# Patient Record
Sex: Female | Born: 1995 | Race: White | Hispanic: No | Marital: Married | Smoking: Former smoker
Health system: Southern US, Community
[De-identification: ages and names within clinical notes are randomized; demographics above are authoritative.]

## PROBLEM LIST (undated history)

## (undated) ENCOUNTER — Inpatient Hospital Stay (HOSPITAL_COMMUNITY): Payer: Self-pay

## (undated) DIAGNOSIS — F32A Depression, unspecified: Secondary | ICD-10-CM

## (undated) DIAGNOSIS — G43909 Migraine, unspecified, not intractable, without status migrainosus: Secondary | ICD-10-CM

## (undated) DIAGNOSIS — Z8709 Personal history of other diseases of the respiratory system: Secondary | ICD-10-CM

## (undated) DIAGNOSIS — I471 Supraventricular tachycardia, unspecified: Secondary | ICD-10-CM

## (undated) DIAGNOSIS — Z9889 Other specified postprocedural states: Secondary | ICD-10-CM

## (undated) DIAGNOSIS — Z8489 Family history of other specified conditions: Secondary | ICD-10-CM

## (undated) DIAGNOSIS — G932 Benign intracranial hypertension: Secondary | ICD-10-CM

## (undated) DIAGNOSIS — Z87442 Personal history of urinary calculi: Secondary | ICD-10-CM

## (undated) DIAGNOSIS — I10 Essential (primary) hypertension: Secondary | ICD-10-CM

## (undated) DIAGNOSIS — R112 Nausea with vomiting, unspecified: Secondary | ICD-10-CM

## (undated) HISTORY — PX: GASTRIC BYPASS: SHX52

## (undated) HISTORY — PX: TONSILLECTOMY AND ADENOIDECTOMY: SUR1326

---
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
## 2017-01-17 ENCOUNTER — Inpatient Hospital Stay (HOSPITAL_COMMUNITY)
Admission: AD | Admit: 2017-01-17 | Payer: Managed Care, Other (non HMO) | Source: Ambulatory Visit | Admitting: Obstetrics & Gynecology

## 2017-03-25 ENCOUNTER — Encounter (HOSPITAL_COMMUNITY): Payer: Self-pay | Admitting: *Deleted

## 2017-03-25 ENCOUNTER — Observation Stay (HOSPITAL_COMMUNITY)
Admission: AD | Admit: 2017-03-25 | Discharge: 2017-03-26 | Disposition: A | Discharge status: Home / Self Care | Payer: Managed Care, Other (non HMO) | Source: Ambulatory Visit | Attending: Obstetrics and Gynecology | Admitting: Obstetrics and Gynecology

## 2017-03-25 DIAGNOSIS — G43009 Migraine without aura, not intractable, without status migrainosus: Secondary | ICD-10-CM

## 2017-03-25 DIAGNOSIS — Z87728 Personal history of other specified (corrected) congenital malformations of nervous system and sense organs: Secondary | ICD-10-CM | POA: Diagnosis not present

## 2017-03-25 DIAGNOSIS — R51 Headache: Secondary | ICD-10-CM | POA: Diagnosis present

## 2017-03-25 DIAGNOSIS — O9989 Other specified diseases and conditions complicating pregnancy, childbirth and the puerperium: Principal | ICD-10-CM | POA: Insufficient documentation

## 2017-03-25 DIAGNOSIS — Z3A26 26 weeks gestation of pregnancy: Secondary | ICD-10-CM | POA: Diagnosis not present

## 2017-03-25 DIAGNOSIS — O219 Vomiting of pregnancy, unspecified: Secondary | ICD-10-CM | POA: Diagnosis not present

## 2017-03-25 DIAGNOSIS — G43909 Migraine, unspecified, not intractable, without status migrainosus: Secondary | ICD-10-CM | POA: Insufficient documentation

## 2017-03-25 DIAGNOSIS — O169 Unspecified maternal hypertension, unspecified trimester: Secondary | ICD-10-CM | POA: Diagnosis present

## 2017-03-25 DIAGNOSIS — H53149 Visual discomfort, unspecified: Secondary | ICD-10-CM | POA: Insufficient documentation

## 2017-03-25 DIAGNOSIS — O162 Unspecified maternal hypertension, second trimester: Secondary | ICD-10-CM

## 2017-03-25 DIAGNOSIS — Z8669 Personal history of other diseases of the nervous system and sense organs: Secondary | ICD-10-CM | POA: Diagnosis not present

## 2017-03-25 DIAGNOSIS — Z79899 Other long term (current) drug therapy: Secondary | ICD-10-CM | POA: Diagnosis not present

## 2017-03-25 HISTORY — DX: Nausea with vomiting, unspecified: R11.2

## 2017-03-25 HISTORY — DX: Nausea with vomiting, unspecified: Z98.890

## 2017-03-25 LAB — URINALYSIS, ROUTINE W REFLEX MICROSCOPIC
Bilirubin Urine: NEGATIVE
Glucose, UA: NEGATIVE mg/dL
Ketones, ur: 20 mg/dL — AB
Nitrite: NEGATIVE
Protein, ur: 30 mg/dL — AB
Specific Gravity, Urine: 1.026 (ref 1.005–1.030)
pH: 6 (ref 5.0–8.0)

## 2017-03-25 LAB — CBC
HCT: 37 % (ref 36.0–46.0)
Hemoglobin: 12 g/dL (ref 12.0–15.0)
MCH: 25.8 pg — ABNORMAL LOW (ref 26.0–34.0)
MCHC: 32.4 g/dL (ref 30.0–36.0)
MCV: 79.6 fL (ref 78.0–100.0)
Platelets: 276 10*3/uL (ref 150–400)
RBC: 4.65 MIL/uL (ref 3.87–5.11)
RDW: 14.6 % (ref 11.5–15.5)
WBC: 10.2 10*3/uL (ref 4.0–10.5)

## 2017-03-25 LAB — COMPREHENSIVE METABOLIC PANEL
ALT: 29 U/L (ref 14–54)
AST: 19 U/L (ref 15–41)
Albumin: 3.1 g/dL — ABNORMAL LOW (ref 3.5–5.0)
Alkaline Phosphatase: 82 U/L (ref 38–126)
Anion gap: 10 (ref 5–15)
BUN: 7 mg/dL (ref 6–20)
CO2: 21 mmol/L — ABNORMAL LOW (ref 22–32)
Calcium: 9.3 mg/dL (ref 8.9–10.3)
Chloride: 106 mmol/L (ref 101–111)
Creatinine, Ser: 0.47 mg/dL (ref 0.44–1.00)
GFR calc Af Amer: >60 mL/min (ref >60)
GFR calc non Af Amer: >60 mL/min (ref >60)
Glucose, Bld: 85 mg/dL (ref 65–99)
Potassium: 3.9 mmol/L (ref 3.5–5.1)
Sodium: 137 mmol/L (ref 135–145)
Total Bilirubin: 0.2 mg/dL — ABNORMAL LOW (ref 0.3–1.2)
Total Protein: 7 g/dL (ref 6.5–8.1)

## 2017-03-25 LAB — PROTEIN / CREATININE RATIO, URINE
Creatinine, Urine: 212 mg/dL
Protein Creatinine Ratio: 0.17 mg/mg{Cre} — ABNORMAL HIGH (ref 0.00–0.15)
Total Protein, Urine: 35 mg/dL

## 2017-03-25 MED ORDER — PRENATAL MULTIVITAMIN CH
1.0000 | ORAL_TABLET | Freq: Every day | ORAL | Status: DC
  Administered 2017-03-26: 1 via ORAL
  Filled 2017-03-25: qty 1

## 2017-03-25 MED ORDER — LACTATED RINGERS IV SOLN
INTRAVENOUS | Status: DC
  Administered 2017-03-25: 18:00:00 via INTRAVENOUS

## 2017-03-25 MED ORDER — CALCIUM CARBONATE ANTACID 500 MG PO CHEW
2.0000 | CHEWABLE_TABLET | ORAL | Status: DC | PRN
  Administered 2017-03-26: 400 mg via ORAL
  Filled 2017-03-25: qty 2

## 2017-03-25 MED ORDER — LABETALOL HCL 5 MG/ML IV SOLN
20.0000 mg | INTRAVENOUS | Status: AC | PRN
  Administered 2017-03-25: 40 mg via INTRAVENOUS
  Administered 2017-03-25: 20 mg via INTRAVENOUS
  Administered 2017-03-25: 80 mg via INTRAVENOUS
  Filled 2017-03-25: qty 4
  Filled 2017-03-25: qty 8
  Filled 2017-03-25: qty 16

## 2017-03-25 MED ORDER — METOCLOPRAMIDE HCL 5 MG/ML IJ SOLN
5.0000 mg | Freq: Once | INTRAMUSCULAR | Status: AC
  Administered 2017-03-25: 5 mg via INTRAVENOUS
  Filled 2017-03-25: qty 2

## 2017-03-25 MED ORDER — ZOLPIDEM TARTRATE 5 MG PO TABS: 5.0000 mg | ORAL_TABLET | Freq: Every evening | ORAL | Status: DC | PRN

## 2017-03-25 MED ORDER — DIPHENHYDRAMINE HCL 50 MG/ML IJ SOLN
25.0000 mg | Freq: Once | INTRAMUSCULAR | Status: AC
  Administered 2017-03-25: 25 mg via INTRAVENOUS
  Filled 2017-03-25: qty 1

## 2017-03-25 MED ORDER — HYDRALAZINE HCL 20 MG/ML IJ SOLN: 10.0000 mg | Freq: Once | INTRAMUSCULAR | Status: DC | PRN

## 2017-03-25 MED ORDER — DOCUSATE SODIUM 100 MG PO CAPS
100.0000 mg | ORAL_CAPSULE | Freq: Every day | ORAL | Status: DC
  Administered 2017-03-26: 100 mg via ORAL
  Filled 2017-03-25: qty 1

## 2017-03-25 MED ORDER — ACETAMINOPHEN 325 MG PO TABS
650.0000 mg | ORAL_TABLET | ORAL | Status: DC | PRN
  Administered 2017-03-25 – 2017-03-26 (×2): 650 mg via ORAL
  Filled 2017-03-25 (×2): qty 2

## 2017-03-25 NOTE — MAU Note (Signed)
this morning woke up, and was vomiting. Has vomited all day.  Has a severe migraine and is wanting "eyes checked". Was told to come here to see if she is dehydrated.  Took ES Tylenol, did not help

## 2017-03-25 NOTE — MAU Provider Note (Signed)
Chief Complaint:  Emesis and Headache   First Provider Initiated Contact with Patient 03/25/17 1657     HPI: Melinda Riddle is a 21 y.o. G1P0 at 71w0dwho presents to maternity admissions reporting headache and vomiting.  Has a history of migraines and pseudotumor cerebri.  Is not exactly like previous migraines.. She reports good fetal movement, denies LOF, vaginal bleeding, vaginal itching/burning, urinary symptoms, h/a, dizziness, n/v, diarrhea, constipation or fever/chills.  She denies headache, visual changes or RUQ abdominal pain.  Emesis   This is a new problem. The current episode started today. The problem occurs 5 to 10 times per day. The problem has been unchanged. There has been no fever. Associated symptoms include headaches. Pertinent negatives include no abdominal pain, chest pain, diarrhea, dizziness, fever or myalgias. She has tried nothing for the symptoms.  Headache   This is a new problem. The current episode started today. The problem occurs constantly. The problem has been unchanged. The pain is located in the frontal and bilateral region. The pain does not radiate. The pain quality is not similar to prior headaches. The quality of the pain is described as aching and squeezing. The pain is moderate. Associated symptoms include nausea, photophobia and vomiting. Pertinent negatives include no abdominal pain, back pain, dizziness, fever, muscle aches, neck pain, numbness, seizures, sinus pressure, sore throat or visual change. The symptoms are aggravated by bright light. She has tried nothing for the symptoms. Her past medical history is significant for migraine headaches and pseudotumor cerebri. There is no history of hypertension.   RN Note: this morning woke up, and was vomiting. Has vomited all day.  Has a severe migraine and is wanting "eyes checked". Was told to come here to see if she is dehydrated.  Took ES Tylenol, did not help  Past Medical History: Past Medical  History:  Diagnosis Date  . Family history of adverse reaction to anesthesia    grandmother is hard to wake up  . History of bronchitis as a child   . History of kidney stones 10/2015  . Migraines    takes Propranolol daily and Zanaflex as needed  . Nausea    takes Zofran as needed  . Pneumonia    as a baby   . Pseudotumor cerebri     Past obstetric history: OB History  Gravida Para Term Preterm AB Living  1            SAB TAB Ectopic Multiple Live Births               # Outcome Date GA Lbr Len/2nd Weight Sex Delivery Anes PTL Lv  1 Current               Past Surgical History: Past Surgical History:  Procedure Laterality Date  . CHOLECYSTECTOMY N/A 12/20/2015   Procedure: LAPAROSCOPIC CHOLECYSTECTOMY;  Surgeon: Abigail Miyamoto, MD;  Location: [facility] OR;  Service: General;  Laterality: N/A;  . TONSILLECTOMY AND ADENOIDECTOMY     3 or 21 years old     Family History: Family History  Problem Relation Age of Onset  . Lung cancer Maternal Grandfather        Died at 57    Social History: Social History  Substance Use Topics  . Smoking status: Never Smoker  . Smokeless tobacco: Never Used     Comment: Mom and step dad smoke outside  . Alcohol use No    Allergies:  Allergies  Allergen Reactions  . Chlorhexidine Rash  Itching burning and rash  . Dust Mite Mixed Allergen Ext [Mite (D. Farinae)] Itching    Meds:  Prescriptions Prior to Admission  Medication Sig Dispense Refill Last Dose  . acetaminophen (TYLENOL) 500 MG tablet Take 500 mg by mouth every 6 (six) hours as needed for mild pain.   Past Week at Unknown time  . DICLEGIS 10-10 MG TBEC Take 1-2 tablets by mouth 3 (three) times daily. Patient takes one tablet in the morning, one tablet at lunch time and 2 tablets at bedtime.   12/12/2016 at Unknown time  . Prenatal Vit-Fe Fumarate-FA (PRENATAL MULTIVITAMIN) TABS tablet Take 1 tablet by mouth daily at 12 noon.   Past Week at Unknown time  . promethazine  (PHENERGAN) 25 MG tablet Take 0.5-1 tablets (12.5-25 mg total) by mouth every 6 (six) hours as needed. 30 tablet 2     I have reviewed patient's Past Medical Hx, Surgical Hx, Family Hx, Social Hx, medications and allergies.   ROS:  Review of Systems  Constitutional: Negative for fever.  HENT: Negative for sinus pressure and sore throat.   Eyes: Positive for photophobia.  Cardiovascular: Negative for chest pain.  Gastrointestinal: Positive for nausea and vomiting. Negative for abdominal pain and diarrhea.  Musculoskeletal: Negative for back pain, myalgias and neck pain.  Neurological: Positive for headaches. Negative for dizziness, seizures and numbness.   Other systems negative  Physical Exam  Patient Vitals for the past 24 hrs:  BP Temp Temp src Pulse Resp SpO2 Weight  03/25/17 1651 (!) 158/94 98 F (36.7 C) Oral 89 18 100 % (!) 318 lb (144.2 kg)     --  --  --  -- BM             03/25/17 1856  --  85  --  --   174/97  --  --  --  -- BM   03/25/17 1841  --  84  --  --   168/92  --  --  --  -- BM   03/25/17 1838  --  94  --  --   152/116  --  --  --  -- BM   03/25/17 1830  --  98  --  --   160/132  --  --  --  -- BM   03/25/17 1828  --  89  --  --   159/128  --  --  --  -- BM   03/25/17 1813  --   101  --  --   176/116  --  --  --  -- BM   03/25/17 1742  --   102  --  --   163/120  --  --  --  -- BM   03/25/17 1714  --  97  --  --   162/109               Post Labetalol:  Vitals:   03/25/17 1950 03/25/17 1958 03/25/17 2012 03/25/17 2037  BP: 140/80 125/90 121/70 (!) 148/87  Pulse: 87 96 87   Resp:      Temp:      TempSrc:      SpO2:      Weight:        Constitutional: Well-developed, well-nourished female in no acute distress.  Cardiovascular: normal rate and rhythm Respiratory: normal effort, clear to auscultation bilaterally GI: Abd soft, non-tender, gravid appropriate for gestational age.   No rebound or guarding. MS: Extremities nontender, Trace edema,  normal  ROM Neurologic: Alert and oriented x 4. DTRs 2+ with no clonus GU: Neg CVAT.  PELVIC EXAM: deferred    FHT:  Baseline 150 , moderate variability, small accelerations present, no decelerations Contractions: Rare   Labs: Results for orders placed or performed during the hospital encounter of 03/25/17 (from the past 24 hour(s))  Urinalysis, Routine w reflex microscopic     Status: Abnormal   Collection Time: 03/25/17  4:55 PM  Result Value Ref Range   Color, Urine YELLOW YELLOW   APPearance HAZY (A) CLEAR   Specific Gravity, Urine 1.026 1.005 - 1.030   pH 6.0 5.0 - 8.0   Glucose, UA NEGATIVE NEGATIVE mg/dL   Hgb urine dipstick LARGE (A) NEGATIVE   Bilirubin Urine NEGATIVE NEGATIVE   Ketones, ur 20 (A) NEGATIVE mg/dL   Protein, ur 30 (A) NEGATIVE mg/dL   Nitrite NEGATIVE NEGATIVE   Leukocytes, UA TRACE (A) NEGATIVE   RBC / HPF TOO NUMEROUS TO COUNT 0 - 5 RBC/hpf   WBC, UA 0-5 0 - 5 WBC/hpf   Bacteria, UA RARE (A) NONE SEEN   Squamous Epithelial / LPF 6-30 (A) NONE SEEN   Mucous PRESENT   Protein / creatinine ratio, urine     Status: Abnormal   Collection Time: 03/25/17  4:55 PM  Result Value Ref Range   Creatinine, Urine 212.00 mg/dL   Total Protein, Urine 35 mg/dL   Protein Creatinine Ratio 0.17 (H) 0.00 - 0.15 mg/mg[Cre]  CBC     Status: Abnormal   Collection Time: 03/25/17  5:48 PM  Result Value Ref Range   WBC 10.2 4.0 - 10.5 K/uL   RBC 4.65 3.87 - 5.11 MIL/uL   Hemoglobin 12.0 12.0 - 15.0 g/dL   HCT 16.1 09.6 - 04.5 %   MCV 79.6 78.0 - 100.0 fL   MCH 25.8 (L) 26.0 - 34.0 pg   MCHC 32.4 30.0 - 36.0 g/dL   RDW 40.9 81.1 - 91.4 %   Platelets 276 150 - 400 K/uL  Comprehensive metabolic panel     Status: Abnormal   Collection Time: 03/25/17  5:48 PM  Result Value Ref Range   Sodium 137 135 - 145 mmol/L   Potassium 3.9 3.5 - 5.1 mmol/L   Chloride 106 101 - 111 mmol/L   CO2 21 (L) 22 - 32 mmol/L   Glucose, Bld 85 65 - 99 mg/dL   BUN 7 6 - 20 mg/dL   Creatinine, Ser  7.82 0.44 - 1.00 mg/dL   Calcium 9.3 8.9 - 95.6 mg/dL   Total Protein 7.0 6.5 - 8.1 g/dL   Albumin 3.1 (L) 3.5 - 5.0 g/dL   AST 19 15 - 41 U/L   ALT 29 14 - 54 U/L   Alkaline Phosphatase 82 38 - 126 U/L   Total Bilirubin 0.2 (L) 0.3 - 1.2 mg/dL   GFR calc non Af Amer >60 >60 mL/min   GFR calc Af Amer >60 >60 mL/min   Anion gap 10 5 - 15       Imaging:  No results found.  MAU Course/MDM: I have ordered labs and reviewed results.  NST reviewed Consult Dr Renaldo Fiddler with presentation, exam findings and test results.  Treatments in MAU included Labetalol for severe range BPs with good response after 3 doses.    Assessment: Single IUP at [redacted]w[redacted]d Headache, likely migraine, responded well to Reglan and Benadryl Hypertension in pregnancy, some severe, improved after Labetalol   Plan: Admit to Antenatal  for observation   Wynelle BourgeoisMarie Lilton Pare CNM, MSN Certified Nurse-Midwife 03/25/2017 4:57 PM

## 2017-03-26 DIAGNOSIS — O9989 Other specified diseases and conditions complicating pregnancy, childbirth and the puerperium: Secondary | ICD-10-CM | POA: Diagnosis not present

## 2017-03-26 LAB — COMPREHENSIVE METABOLIC PANEL
ALT: 24 U/L (ref 14–54)
AST: 17 U/L (ref 15–41)
Albumin: 2.6 g/dL — ABNORMAL LOW (ref 3.5–5.0)
Alkaline Phosphatase: 67 U/L (ref 38–126)
Anion gap: 8 (ref 5–15)
BUN: 8 mg/dL (ref 6–20)
CO2: 21 mmol/L — ABNORMAL LOW (ref 22–32)
Calcium: 8.5 mg/dL — ABNORMAL LOW (ref 8.9–10.3)
Chloride: 109 mmol/L (ref 101–111)
Creatinine, Ser: 0.45 mg/dL (ref 0.44–1.00)
GFR calc Af Amer: >60 mL/min (ref >60)
GFR calc non Af Amer: >60 mL/min (ref >60)
Glucose, Bld: 91 mg/dL (ref 65–99)
Potassium: 3.5 mmol/L (ref 3.5–5.1)
Sodium: 138 mmol/L (ref 135–145)
Total Bilirubin: 0.3 mg/dL (ref 0.3–1.2)
Total Protein: 5.9 g/dL — ABNORMAL LOW (ref 6.5–8.1)

## 2017-03-26 LAB — CBC
HCT: 31.7 % — ABNORMAL LOW (ref 36.0–46.0)
Hemoglobin: 10.4 g/dL — ABNORMAL LOW (ref 12.0–15.0)
MCH: 26.1 pg (ref 26.0–34.0)
MCHC: 32.8 g/dL (ref 30.0–36.0)
MCV: 79.6 fL (ref 78.0–100.0)
Platelets: 235 10*3/uL (ref 150–400)
RBC: 3.98 MIL/uL (ref 3.87–5.11)
RDW: 14.7 % (ref 11.5–15.5)
WBC: 8.4 10*3/uL (ref 4.0–10.5)

## 2017-03-26 LAB — ABO/RH: ABO/RH(D): A NEG

## 2017-03-26 LAB — TYPE AND SCREEN
ABO/RH(D): A NEG
Antibody Screen: NEGATIVE

## 2017-03-26 MED ORDER — ONDANSETRON HCL 4 MG/2ML IJ SOLN
4.0000 mg | Freq: Once | INTRAMUSCULAR | Status: AC
  Administered 2017-03-26: 4 mg via INTRAVENOUS
  Filled 2017-03-26: qty 2

## 2017-03-26 MED ORDER — ONDANSETRON HCL 4 MG PO TABS
4.0000 mg | ORAL_TABLET | Freq: Once | ORAL | Status: AC
  Administered 2017-03-26: 4 mg via ORAL
  Filled 2017-03-26: qty 1

## 2017-03-26 MED ORDER — ONDANSETRON HCL 4 MG PO TABS: 4.0000 mg | ORAL_TABLET | Freq: Three times a day (TID) | ORAL | 0 refills | Status: DC | PRN

## 2017-03-26 NOTE — Progress Notes (Signed)
Patient discharged home with mother. Discharge teaching, paperwork, teaching, home care, prescriptions, and reasons to seek medical care reviewed. Follow-up appt made by mother for neurologist and ophthalmologist.  No questions at this time.

## 2017-03-26 NOTE — H&P (Signed)
Melinda Riddle is a 21 y.o. female presenting with c/o HA & n/v.  Pt has h/o migreaines and pseudotumor cerebri.  No ctx, vb or lof.  Good FM.  Upon arrival, pt noted to have elevated BP 160-170s/90s-120s.  Pt received 2 doses IV labetalol, IVF, migraine cocktail and reported HA, nausea resolved.  Pt does not have history of HTN . OB History    Gravida Para Term Preterm AB Living   1             SAB TAB Ectopic Multiple Live Births                 Past Medical History:  Diagnosis Date  . Family history of adverse reaction to anesthesia    grandmother is hard to wake up  . History of bronchitis as a child   . History of kidney stones 10/2015  . Migraines    takes Propranolol daily and Zanaflex as needed  . Nausea    takes Zofran as needed  . Pneumonia    as a baby   . PONV (postoperative nausea and vomiting)   . Pseudotumor cerebri    Past Surgical History:  Procedure Laterality Date  . CHOLECYSTECTOMY N/A 12/20/2015   Procedure: LAPAROSCOPIC CHOLECYSTECTOMY;  Surgeon: Melinda Miyamoto, MD;  Location: [facility] OR;  Service: General;  Laterality: N/A;  . TONSILLECTOMY AND ADENOIDECTOMY     65 or 21 years old    Family History: family history includes Lung cancer in her maternal grandfather. Social History:  reports that she has never smoked. She has never used smokeless tobacco. She reports that she does not drink alcohol or use drugs.       ROS History   Blood pressure (!) 105/51, pulse 70, temperature 98.3 F (36.8 C), temperature source Oral, resp. rate 18, weight (!) 318 lb (144.2 kg), last menstrual period 03/18/2016, SpO2 100 %. Exam Physical Exam  Gen - NAD CV - RRR Lungs - clear Abd - gravid, NT Ext - NT, 2+ DTR, no clonus   Results for orders placed or performed during the hospital encounter of 03/25/17 (from the past 24 hour(s))  Urinalysis, Routine w reflex microscopic     Status: Abnormal   Collection Time: 03/25/17  4:55 PM  Result Value Ref Range   Color, Urine YELLOW YELLOW   APPearance HAZY (A) CLEAR   Specific Gravity, Urine 1.026 1.005 - 1.030   pH 6.0 5.0 - 8.0   Glucose, UA NEGATIVE NEGATIVE mg/dL   Hgb urine dipstick LARGE (A) NEGATIVE   Bilirubin Urine NEGATIVE NEGATIVE   Ketones, ur 20 (A) NEGATIVE mg/dL   Protein, ur 30 (A) NEGATIVE mg/dL   Nitrite NEGATIVE NEGATIVE   Leukocytes, UA TRACE (A) NEGATIVE   RBC / HPF TOO NUMEROUS TO COUNT 0 - 5 RBC/hpf   WBC, UA 0-5 0 - 5 WBC/hpf   Bacteria, UA RARE (A) NONE SEEN   Squamous Epithelial / LPF 6-30 (A) NONE SEEN   Mucous PRESENT   Protein / creatinine ratio, urine     Status: Abnormal   Collection Time: 03/25/17  4:55 PM  Result Value Ref Range   Creatinine, Urine 212.00 mg/dL   Total Protein, Urine 35 mg/dL   Protein Creatinine Ratio 0.17 (H) 0.00 - 0.15 mg/mg[Cre]  CBC     Status: Abnormal   Collection Time: 03/25/17  5:48 PM  Result Value Ref Range   WBC 10.2 4.0 - 10.5 K/uL   RBC 4.65 3.87 -  5.11 MIL/uL   Hemoglobin 12.0 12.0 - 15.0 g/dL   HCT 16.137.0 09.636.0 - 04.546.0 %   MCV 79.6 78.0 - 100.0 fL   MCH 25.8 (L) 26.0 - 34.0 pg   MCHC 32.4 30.0 - 36.0 g/dL   RDW 40.914.6 81.111.5 - 91.415.5 %   Platelets 276 150 - 400 K/uL  Comprehensive metabolic panel     Status: Abnormal   Collection Time: 03/25/17  5:48 PM  Result Value Ref Range   Sodium 137 135 - 145 mmol/L   Potassium 3.9 3.5 - 5.1 mmol/L   Chloride 106 101 - 111 mmol/L   CO2 21 (L) 22 - 32 mmol/L   Glucose, Bld 85 65 - 99 mg/dL   BUN 7 6 - 20 mg/dL   Creatinine, Ser 7.820.47 0.44 - 1.00 mg/dL   Calcium 9.3 8.9 - 95.610.3 mg/dL   Total Protein 7.0 6.5 - 8.1 g/dL   Albumin 3.1 (L) 3.5 - 5.0 g/dL   AST 19 15 - 41 U/L   ALT 29 14 - 54 U/L   Alkaline Phosphatase 82 38 - 126 U/L   Total Bilirubin 0.2 (L) 0.3 - 1.2 mg/dL   GFR calc non Af Amer >60 >60 mL/min   GFR calc Af Amer >60 >60 mL/min   Anion gap 10 5 - 15  [facility]      Status: None (Preliminary result)   Collection Time: 03/26/17   5:20 AM  Result Value Ref Range   ABO/RH(D) A NEG    Antibody Screen PENDING    Sample Expiration 03/29/2017   CBC     Status: Abnormal   Collection Time: 03/26/17  5:20 AM  Result Value Ref Range   WBC 8.4 4.0 - 10.5 K/uL   RBC 3.98 3.87 - 5.11 MIL/uL   Hemoglobin 10.4 (L) 12.0 - 15.0 g/dL   HCT 21.331.7 (L) 08.636.0 - 57.846.0 %   MCV 79.6 78.0 - 100.0 fL   MCH 26.1 26.0 - 34.0 pg   MCHC 32.8 30.0 - 36.0 g/dL   RDW 46.914.7 62.911.5 - 52.815.5 %   Platelets 235 150 - 400 K/uL  Comprehensive metabolic panel     Status: Abnormal   Collection Time: 03/26/17  5:20 AM  Result Value Ref Range   Sodium 138 135 - 145 mmol/L   Potassium 3.5 3.5 - 5.1 mmol/L   Chloride 109 101 - 111 mmol/L   CO2 21 (L) 22 - 32 mmol/L   Glucose, Bld 91 65 - 99 mg/dL   BUN 8 6 - 20 mg/dL   Creatinine, Ser 4.130.45 0.44 - 1.00 mg/dL   Calcium 8.5 (L) 8.9 - 10.3 mg/dL   Total Protein 5.9 (L) 6.5 - 8.1 g/dL   Albumin 2.6 (L) 3.5 - 5.0 g/dL   AST 17 15 - 41 U/L   ALT 24 14 - 54 U/L   Alkaline Phosphatase 67 38 - 126 U/L   Total Bilirubin 0.3 0.3 - 1.2 mg/dL   GFR calc non Af Amer >60 >60 mL/min   GFR calc Af Amer >60 >60 mL/min   Anion gap 8 5 - 15         Assessment/Plan: 21yo G1 @ 26 wks Suspect migraine HA but given severe range BP rec obs overnight to watch BP Will repeat labs      Melinda Riddle 03/26/2017, 6:19 AM

## 2017-03-26 NOTE — Progress Notes (Signed)
Dr. Elon SpannerLeger notified of patient's n/v. Order for 4mg  PO zofran once given.

## 2017-03-26 NOTE — Progress Notes (Signed)
Pt reports sleeping well and feeling good overnight.  This morning, she reports return of HA with associated n/v.  Good FM.  No ctx, vb or lof  AF, VSS   BP 132/65 Gen - NAD ABd - gravid, NT Ext - NT  A/P:  26+1 wks with HA and n/v zofran being given If symptoms persist and BP remain normal - will consult her neurologist (dr Janeece Fittinghickley) given her h/o pseudotumor & migraines

## 2017-03-26 NOTE — Discharge Summary (Signed)
Obstetric Discharge Summary Reason for Admission: HA Prenatal Procedures: none Intrapartum Procedures: NA   21 yo @26 .1wga w/h/o pseudotumor cerebri and severe migraines who presented with sudden onset HA, N/V and HTN. The patient feels this episode is the same as when she has had prior flares in the past.  She required Lab x 2 IV on admission has had normal to mild range BP since her nausea has improved. Her HA and N/V have resolved. Her PIH labs were sent and were WNL. Consulted with her Neurologist, Dr. Sharene SkeansHickling, who feels she is appropriate for outpatient management. She should follow up with him within the week and with Opthalmology as well for a fundoscopic exam. Given clinical picture and history, I do not think this is pre-eclampsia. However, preeclampsia cannot completely be excluded and she needs to be followed closely with us in the office until her outpatient wu for pseudotumor cerebri is completed. She should see us in one week. She has been given strict return precautions and she will call us with any abnormality or come straight to clinic or MAU.    Hemoglobin  Date Value Ref Range Status  03/26/2017 10.4 (L) 12.0 - 15.0 g/dL Final   HCT  Date Value Ref Range Status  03/26/2017 31.7 (L) 36.0 - 46.0 % Final    Physical Exam:  General: alert, cooperative and appears older than stated age  Gen: NAD, well appearing, sitting up in bed smilling CV: Reg rate Pulm: NWOB Abd: soft, gravid Neuro: Cns 2-12 grossly intact, reflexes WNL  Discharge Diagnoses: Migraine HA  Discharge Information: Date: 03/26/2017 Activity: unrestricted Diet: routine Medications: zofran Condition: stable and improved Instructions: refer to practice specific booklet Discharge to: home   Ranae Pilalise Jennifer Jasara Corrigan 03/26/2017, 2:47 PM

## 2017-03-26 NOTE — Progress Notes (Signed)
S: No complaints. Reports feels well. No HA. Felt nauseous after ate a cheeseburger and fries but now improved. Has not had emesis since this AM. Denies HA, change in vision, SOB, CP, LE swelling, etc.   Vitals:   03/26/17 0727 03/26/17 1200  BP: 132/65 140/71  Pulse: 83 91  Resp: 20 20  Temp: 98.3 F (36.8 C) 97.9 F (36.6 C)   Gen: NAD, well appearing, sitting up in bed smilling CV: Reg rate Pulm: NWOB Abd: soft, gravid Neuro: Cns 2-12 grossly intact, reflexes WNL   A/P: 21 yo @26 .1wga w/h/o pseudotumor cerebri and severe migraines who presented with sudden onset HA, N/V and HTN. The patient feels this episode is the same as when she has had prior flares in the past.  She required Lab x 2 IV on admission has had normal to mild range BP since her nausea has improved. Her HA and N/V have resolved. Her PIH labs were sent and were WNL. Consulted with her Neurologist, Dr. Sharene SkeansHickling, who feels she is appropriate for outpatient management. She should follow up with him within the week and with Opthalmology as well for a fundoscopic exam. Given clinical picture and history, I do not think this is pre-eclampsia. However, preeclampsia cannot completely be excluded and she needs to be followed closely with us in the office until her outpatient wu for pseudotumor cerebri is completed. She should see us in one week. She has been given strict return precautions and she will call us with any abnormality or come straight to clinic or MAU.     Rosie FateE Prudence Heiny MD

## 2017-04-04 ENCOUNTER — Ambulatory Visit (INDEPENDENT_AMBULATORY_CARE_PROVIDER_SITE_OTHER): Payer: Managed Care, Other (non HMO) | Admitting: Pediatrics

## 2017-04-04 ENCOUNTER — Encounter (INDEPENDENT_AMBULATORY_CARE_PROVIDER_SITE_OTHER): Payer: Self-pay | Admitting: Pediatrics

## 2017-04-04 VITALS — BP 106/72 | HR 100 | Ht 65.5 in | Wt 318.2 lb

## 2017-04-04 DIAGNOSIS — G43009 Migraine without aura, not intractable, without status migrainosus: Secondary | ICD-10-CM

## 2017-04-04 DIAGNOSIS — G44219 Episodic tension-type headache, not intractable: Secondary | ICD-10-CM | POA: Diagnosis not present

## 2017-04-04 DIAGNOSIS — L83 Acanthosis nigricans: Secondary | ICD-10-CM

## 2017-04-04 DIAGNOSIS — G932 Benign intracranial hypertension: Secondary | ICD-10-CM | POA: Diagnosis not present

## 2017-04-04 DIAGNOSIS — Z3492 Encounter for supervision of normal pregnancy, unspecified, second trimester: Secondary | ICD-10-CM

## 2017-04-04 NOTE — Patient Instructions (Signed)
I looked at your eyes, and I don't see significant swelling of your discs.  The ophthalmologist has more sensitive tools to look at this than I do.  I think that things are stable and that we don't need to think about treatment at this time.  If your headaches change quality and become pain that you can't treat with Tylenol, is possible that pressure is increasing her head which we would have to treat with a lumbar puncture to diminish your pressure.  I wouldn't do that unless we had to do so to protect your eyes from increased pressure.  We can't use medications like acetazolamide when you're pregnant.  Indeed you can only use Tylenol for pain on a regular basis.

## 2017-04-04 NOTE — Progress Notes (Signed)
Patient: Melinda Riddle MRN: [id number] Sex: female DOB: 09-11-1995  Provider: Ellison CarwinWilliam Jeorge Reister, MD [facility]  Note type: Routine return visit  History of Present Illness: Referral Source: Melinda CarrowWilliam Young, MD History from: fiance', patient and [facility] Chief Complaint: Idiopathic Intracranial Hypertension  Melinda Riddle is a 21 y.o. female who was evaluated on April 04, 2017, for the first time since December 04, 2016.  Melinda Riddle has migraine without aura, morbid obesity, and idiopathic intracranial hypertension.  When I last saw her, she was 8 weeks' pregnant.  Her expected date of confinement is July 01, 2017.  She is having some morning sickness.  She also has morning migraines about 3 times a week.  She was seen by an ophthalmologist recently who said that she had mild papilledema but that it was unchanged.  There is no reason at this time for us to characterize her headaches as being related to idiopathic intracranial hypertension or to consider treatment, which in this case would be lumbar puncture.  She cannot be on acetazolamide while she is pregnant.  Current medications include Tylenol for pain, Zofran for nausea, and Zantac for heartburn.  Ultrasound has revealed that the baby she is carrying is a girl.  Her blood pressure has since dropped into a normal range, although it was measured by my CMA at 130/90, I re-measured her with an oversize cuff at 106/72.  In general, her health is good.  She has gained only a pound and a half since I saw her in March, which is excellent for her.  She has no other concerns at this time.  Review of Systems: 12 system review was remarkable for migraines in the morning; the remainder was assessed and was negative  Past Medical History Diagnosis Date  . Family history of adverse reaction to anesthesia    grandmother is hard to wake up  . History of bronchitis as a child   . History of kidney stones  10/2015  . Migraines    takes Propranolol daily and Zanaflex as needed  . Nausea    takes Zofran as needed  . Pneumonia    as a baby   . PONV (postoperative nausea and vomiting)   . Pseudotumor cerebri    Hospitalizations: Yes.  , Head Injury: No., Nervous System Infections: No., Immunizations up to date: Yes.    Diagnosis of idoipathic intracranial HTN (pseudotumor cerebri), however patient did not have lumbar puncture done, unable to be completed. Prior ophthalmology eval by Dr. Verne CarrowWilliam Riddle (08/09/14) with bilateral papilledema, recent follow-up March, 2016 by Dr. Maple HudsonYoung with reported normal fundoscopic exam with normal appearing optic discs per report.  Noncontrast MRI scan of the brain August 09, 2014 showed subtle right-sided papilledema with , and optic nerve sheaths and protrusion of the optic disc on the right. MRV showed preserved low signal throughout with narrowing of the bilateral transverse-sigmoid sinus junctions, a finding noted in the setting of idiopathic intracranial hypertension.  Birth History 7 lbs. 12 oz. infant born at 5340 weeks gestational age to a 19 year old g 2 p 1 0 0 1 female. Gestation was uncomplicated Mother received Epidural anesthesia  Repeat cesarean section Nursery Course was uncomplicated Growth and Development was recalled as normal  Behavior History none  Surgical History Procedure Laterality Date  . CHOLECYSTECTOMY N/A 12/20/2015   Procedure: LAPAROSCOPIC CHOLECYSTECTOMY;  Surgeon: Abigail Miyamotoouglas Blackman, MD;  [facility] OR;  Service: General;  Laterality: N/A;  . TONSILLECTOMY  AND ADENOIDECTOMY     89 or 21 years old    Family History family history includes Lung cancer in her maternal grandfather. Family history is negative for migraines, seizures, intellectual disabilities, blindness, deafness, birth defects, chromosomal disorder, or autism.  Social History Social History Main Topics  . Smoking status: Never Smoker  . Smokeless tobacco:  Never Used     Comment: Mom and step dad smoke outside  . Alcohol use No  . Drug use: No  . 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    She attended [employer].    She lives with her mother, sister, and step-father.     She enjoys walking in the park, swimming, hanging out with friends, and going to football games.    Allergies Allergen Reactions  . Chlorhexidine Itching, Rash and Other (See Comments)    Reaction:  Burning   . Dust Mite Mixed Allergen Ext [Mite (D. Farinae)] Itching   Physical Exam BP 106/72   Pulse 100   Ht 5' 5.5" (1.664 m)   Wt (!) 318 lb 3.2 oz (144.3 kg)   LMP 03/18/2016   BMI 52.15 kg/m   General: alert, well developed, morbidly obese, in no acute distress, blond hair, blue eyes, right handed Head: normocephalic, no dysmorphic features Ears, Nose and Throat: Otoscopic: tympanic membranes normal; pharynx: oropharynx is pink without exudates or tonsillar hypertrophy Neck: supple, full range of motion, no cranial or cervical bruits Respiratory: auscultation clear Cardiovascular: no murmurs, pulses are normal Musculoskeletal: no skeletal deformities or apparent scoliosis Skin: no rashes or neurocutaneous lesions  Neurologic Exam  Mental Status: alert; oriented to person, place and year; knowledge is normal for age; language is normal Cranial Nerves: visual fields are full to double simultaneous stimuli; extraocular movements are full and conjugate; pupils are round reactive to light; funduscopic examination shows sharp disc margins with normal vessels; symmetric facial strength; midline tongue and uvula; air conduction is greater than bone conduction bilaterally Motor: Normal strength, tone and mass; good fine motor movements; no pronator drift Sensory: intact responses to cold, vibration, proprioception and stereognosis Coordination: good finger-to-nose, rapid repetitive alternating  movements and finger apposition Gait and Station: normal gait and station: patient is able to walk on heels, toes and tandem without difficulty; balance is adequate; Romberg exam is negative; Gower response is negative Reflexes: symmetric and diminished bilaterally; no clonus; bilateral flexor plantar responses  Assessment 1. Migraine without aura and without status migrainosus, not intractable, G43.009. 2. Episodic tension-type headache, not intractable, G44.219. 3. Morbid obesity, E66.01. 4. Acanthosis nigricans, acquired, L83. 5. Idiopathic intracranial hypertension, G93.2.  Discussion I looked in her eyes and do not see significant papilledema.  I would have judged her disc margins as sharp.  I realize that I cannot see in 3 dimensions like her ophthalmologist.  I am not worried at this time about intracranial hypertension and, as long as she can keep her weight stable, I do not think we have to worry.  Tylenol is the only pain medicine she can use on a regular basis, although she could receive a migraine cocktail if she develops status migrainosus.  I am certain that her obstetrician would not allow her to take Zofran or Zantac if there were concerns about it.  Plan At present, there is no reason for me to see her before she delivers unless she has marked worsening of her symptoms.  As long as  her fundus remains stable, there is no reason for her to return to the use of acetazolamide either.  I spent 25 minutes of face-to-face time with Melinda Riddle and her fianc.  She will return to see me in 6 months, but I asked her to contact me if she has concerns about her health through MyChart.   Medication List   Accurate as of 04/04/17  9:30 PM.      acetaminophen 500 MG tablet Commonly known as:  TYLENOL Take 1,000 mg by mouth every 6 (six) hours as needed for mild pain, moderate pain, fever or headache.   ondansetron 8 MG disintegrating tablet Commonly known as:  ZOFRAN-ODT   prenatal  multivitamin Tabs tablet Take 1 tablet by mouth at bedtime.   promethazine 25 MG tablet Commonly known as:  PHENERGAN Take 0.5-1 tablets (12.5-25 mg total) by mouth every 6 (six) hours as needed.   ranitidine 150 MG tablet Commonly known as:  ZANTAC Take 150 mg by mouth 2 (two) times daily.    The medication list was reviewed and reconciled. All changes or newly prescribed medications were explained.  A complete medication list was provided to the patient/caregiver.  Deetta Perla MD

## 2017-04-10 ENCOUNTER — Inpatient Hospital Stay (HOSPITAL_COMMUNITY)
Admission: AD | Admit: 2017-04-10 | Discharge: 2017-04-10 | Disposition: A | Discharge status: Home / Self Care | Payer: Managed Care, Other (non HMO) | Source: Ambulatory Visit | Attending: Obstetrics and Gynecology | Admitting: Obstetrics and Gynecology

## 2017-04-10 ENCOUNTER — Encounter (HOSPITAL_COMMUNITY): Payer: Self-pay

## 2017-04-10 DIAGNOSIS — O368130 Decreased fetal movements, third trimester, not applicable or unspecified: Secondary | ICD-10-CM | POA: Insufficient documentation

## 2017-04-10 DIAGNOSIS — O26893 Other specified pregnancy related conditions, third trimester: Secondary | ICD-10-CM | POA: Diagnosis not present

## 2017-04-10 DIAGNOSIS — Z3A28 28 weeks gestation of pregnancy: Secondary | ICD-10-CM | POA: Insufficient documentation

## 2017-04-10 DIAGNOSIS — R030 Elevated blood-pressure reading, without diagnosis of hypertension: Secondary | ICD-10-CM | POA: Insufficient documentation

## 2017-04-10 DIAGNOSIS — O368120 Decreased fetal movements, second trimester, not applicable or unspecified: Secondary | ICD-10-CM

## 2017-04-10 DIAGNOSIS — O169 Unspecified maternal hypertension, unspecified trimester: Secondary | ICD-10-CM | POA: Diagnosis not present

## 2017-04-10 DIAGNOSIS — Z3689 Encounter for other specified antenatal screening: Secondary | ICD-10-CM

## 2017-04-10 LAB — COMPREHENSIVE METABOLIC PANEL
ALT: 32 U/L (ref 14–54)
AST: 22 U/L (ref 15–41)
Albumin: 2.8 g/dL — ABNORMAL LOW (ref 3.5–5.0)
Alkaline Phosphatase: 84 U/L (ref 38–126)
Anion gap: 9 (ref 5–15)
BUN: 8 mg/dL (ref 6–20)
CO2: 22 mmol/L (ref 22–32)
Calcium: 9.3 mg/dL (ref 8.9–10.3)
Chloride: 105 mmol/L (ref 101–111)
Creatinine, Ser: 0.54 mg/dL (ref 0.44–1.00)
GFR calc Af Amer: >60 mL/min (ref >60)
GFR calc non Af Amer: >60 mL/min (ref >60)
Glucose, Bld: 118 mg/dL — ABNORMAL HIGH (ref 65–99)
Potassium: 3.5 mmol/L (ref 3.5–5.1)
Sodium: 136 mmol/L (ref 135–145)
Total Bilirubin: 0.5 mg/dL (ref 0.3–1.2)
Total Protein: 7 g/dL (ref 6.5–8.1)

## 2017-04-10 LAB — URIC ACID: Uric Acid, Serum: 3.6 mg/dL (ref 2.3–6.6)

## 2017-04-10 LAB — CBC
HCT: 34.1 % — ABNORMAL LOW (ref 36.0–46.0)
Hemoglobin: 11.1 g/dL — ABNORMAL LOW (ref 12.0–15.0)
MCH: 26.2 pg (ref 26.0–34.0)
MCHC: 32.6 g/dL (ref 30.0–36.0)
MCV: 80.4 fL (ref 78.0–100.0)
Platelets: 280 10*3/uL (ref 150–400)
RBC: 4.24 MIL/uL (ref 3.87–5.11)
RDW: 14.1 % (ref 11.5–15.5)
WBC: 8.6 10*3/uL (ref 4.0–10.5)

## 2017-04-10 LAB — PROTEIN / CREATININE RATIO, URINE
Creatinine, Urine: 245 mg/dL
Protein Creatinine Ratio: 0.18 mg/mg{Cre} — ABNORMAL HIGH (ref 0.00–0.15)
Total Protein, Urine: 44 mg/dL

## 2017-04-10 LAB — LACTATE DEHYDROGENASE: LDH: 103 U/L (ref 98–192)

## 2017-04-10 NOTE — Discharge Instructions (Signed)
Third Trimester of Pregnancy The third trimester is from week 28 through week 40 (months 7 through 9). The third trimester is a time when the unborn baby (fetus) is growing rapidly. At the end of the ninth month, the fetus is about 20 inches in length and weighs 6-10 pounds. Body changes during your third trimester Your body will continue to go through many changes during pregnancy. The changes vary from woman to woman. During the third trimester:  Your weight will continue to increase. You can expect to gain 25-35 pounds (11-16 kg) by the end of the pregnancy.  You may begin to get stretch marks on your hips, abdomen, and breasts.  You may urinate more often because the fetus is moving lower into your pelvis and pressing on your bladder.  You may develop or continue to have heartburn. This is caused by increased hormones that slow down muscles in the digestive tract.  You may develop or continue to have constipation because increased hormones slow digestion and cause the muscles that push waste through your intestines to relax.  You may develop hemorrhoids. These are swollen veins (varicose veins) in the rectum that can itch or be painful.  You may develop swollen, bulging veins (varicose veins) in your legs.  You may have increased body aches in the pelvis, back, or thighs. This is due to weight gain and increased hormones that are relaxing your joints.  You may have changes in your hair. These can include thickening of your hair, rapid growth, and changes in texture. Some women also have hair loss during or after pregnancy, or hair that feels dry or thin. Your hair will most likely return to normal after your baby is born.  Your breasts will continue to grow and they will continue to become tender. A yellow fluid (colostrum) may leak from your breasts. This is the first milk you are producing for your baby.  Your belly button may stick out.  You may notice more swelling in your hands,  face, or ankles.  You may have increased tingling or numbness in your hands, arms, and legs. The skin on your belly may also feel numb.  You may feel short of breath because of your expanding uterus.  You may have more problems sleeping. This can be caused by the size of your belly, increased need to urinate, and an increase in your body's metabolism.  You may notice the fetus "dropping," or moving lower in your abdomen (lightening).  You may have increased vaginal discharge.  You may notice your joints feel loose and you may have pain around your pelvic bone.  What to expect at prenatal visits You will have prenatal exams every 2 weeks until week 36. Then you will have weekly prenatal exams. During a routine prenatal visit:  You will be weighed to make sure you and the baby are growing normally.  Your blood pressure will be taken.  Your abdomen will be measured to track your baby's growth.  The fetal heartbeat will be listened to.  Any test results from the previous visit will be discussed.  You may have a cervical check near your due date to see if your cervix has softened or thinned (effaced).  You will be tested for Group B streptococcus. This happens between 35 and 37 weeks.  Your health care provider may ask you:  What your birth plan is.  How you are feeling.  If you are feeling the baby move.  If you have had   any abnormal symptoms, such as leaking fluid, bleeding, severe headaches, or abdominal cramping.  If you are using any tobacco products, including cigarettes, chewing tobacco, and electronic cigarettes.  If you have any questions.  Other tests or screenings that may be performed during your third trimester include:  Blood tests that check for low iron levels (anemia).  Fetal testing to check the health, activity level, and growth of the fetus. Testing is done if you have certain medical conditions or if there are problems during the  pregnancy.  Nonstress test (NST). This test checks the health of your baby to make sure there are no signs of problems, such as the baby not getting enough oxygen. During this test, a belt is placed around your belly. The baby is made to move, and its heart rate is monitored during movement.  What is false labor? False labor is a condition in which you feel small, irregular tightenings of the muscles in the womb (contractions) that usually go away with rest, changing position, or drinking water. These are called Braxton Hicks contractions. Contractions may last for hours, days, or even weeks before true labor sets in. If contractions come at regular intervals, become more frequent, increase in intensity, or become painful, you should see your health care provider. What are the signs of labor?  Abdominal cramps.  Regular contractions that start at 10 minutes apart and become stronger and more frequent with time.  Contractions that start on the top of the uterus and spread down to the lower abdomen and back.  Increased pelvic pressure and dull back pain.  A watery or bloody mucus discharge that comes from the vagina.  Leaking of amniotic fluid. This is also known as your "water breaking." It could be a slow trickle or a gush. Let your health care provider know if it has a color or strange odor. If you have any of these signs, call your health care provider right away, even if it is before your due date. Follow these instructions at home: Medicines  Follow your health care provider's instructions regarding medicine use. Specific medicines may be either safe or unsafe to take during pregnancy.  Take a prenatal vitamin that contains at least 600 micrograms (mcg) of folic acid.  If you develop constipation, try taking a stool softener if your health care provider approves. Eating and drinking  Eat a balanced diet that includes fresh fruits and vegetables, whole grains, good sources of protein  such as meat, eggs, or tofu, and low-fat dairy. Your health care provider will help you determine the amount of weight gain that is right for you.  Avoid raw meat and uncooked cheese. These carry germs that can cause birth defects in the baby.  If you have low calcium intake from food, talk to your health care provider about whether you should take a daily calcium supplement.  Eat four or five small meals rather than three large meals a day.  Limit foods that are high in fat and processed sugars, such as fried and sweet foods.  To prevent constipation: ? Drink enough fluid to keep your urine clear or pale yellow. ? Eat foods that are high in fiber, such as fresh fruits and vegetables, whole grains, and beans. Activity  Exercise only as directed by your health care provider. Most women can continue their usual exercise routine during pregnancy. Try to exercise for 30 minutes at least 5 days a week. Stop exercising if you experience uterine contractions.  Avoid heavy   lifting.  Do not exercise in extreme heat or humidity, or at high altitudes.  Wear low-heel, comfortable shoes.  Practice good posture.  You may continue to have sex unless your health care provider tells you otherwise. Relieving pain and discomfort  Take frequent breaks and rest with your legs elevated if you have leg cramps or low back pain.  Take warm sitz baths to soothe any pain or discomfort caused by hemorrhoids. Use hemorrhoid cream if your health care provider approves.  Wear a good support bra to prevent discomfort from breast tenderness.  If you develop varicose veins: ? Wear support pantyhose or compression stockings as told by your healthcare provider. ? Elevate your feet for 15 minutes, 3-4 times a day. Prenatal care  Write down your questions. Take them to your prenatal visits.  Keep all your prenatal visits as told by your health care provider. This is important. Safety  Wear your seat belt at  all times when driving.  Make a list of emergency phone numbers, including numbers for family, friends, the hospital, and police and fire departments. General instructions  Avoid cat litter boxes and soil used by cats. These carry germs that can cause birth defects in the baby. If you have a cat, ask someone to clean the litter box for you.  Do not travel far distances unless it is absolutely necessary and only with the approval of your health care provider.  Do not use hot tubs, steam rooms, or saunas.  Do not drink alcohol.  Do not use any products that contain nicotine or tobacco, such as cigarettes and e-cigarettes. If you need help quitting, ask your health care provider.  Do not use any medicinal herbs or unprescribed drugs. These chemicals affect the formation and growth of the baby.  Do not douche or use tampons or scented sanitary pads.  Do not cross your legs for long periods of time.  To prepare for the arrival of your baby: ? Take prenatal classes to understand, practice, and ask questions about labor and delivery. ? Make a trial run to the hospital. ? Visit the hospital and tour the maternity area. ? Arrange for maternity or paternity leave through employers. ? Arrange for family and friends to take care of pets while you are in the hospital. ? Purchase a rear-facing car seat and make sure you know how to install it in your car. ? Pack your hospital bag. ? Prepare the baby's nursery. Make sure to remove all pillows and stuffed animals from the baby's crib to prevent suffocation.  Visit your dentist if you have not gone during your pregnancy. Use a soft toothbrush to brush your teeth and be gentle when you floss. Contact a health care provider if:  You are unsure if you are in labor or if your water has broken.  You become dizzy.  You have mild pelvic cramps, pelvic pressure, or nagging pain in your abdominal area.  You have lower back pain.  You have persistent  nausea, vomiting, or diarrhea.  You have an unusual or bad smelling vaginal discharge.  You have pain when you urinate. Get help right away if:  Your water breaks before 37 weeks.  You have regular contractions less than 5 minutes apart before 37 weeks.  You have a fever.  You are leaking fluid from your vagina.  You have spotting or bleeding from your vagina.  You have severe abdominal pain or cramping.  You have rapid weight loss or weight gain.    You have shortness of breath with chest pain.  You notice sudden or extreme swelling of your face, hands, ankles, feet, or legs.  Your baby makes fewer than 10 movements in 2 hours.  You have severe headaches that do not go away when you take medicine.  You have vision changes. Summary  The third trimester is from week 28 through week 40, months 7 through 9. The third trimester is a time when the unborn baby (fetus) is growing rapidly.  During the third trimester, your discomfort may increase as you and your baby continue to gain weight. You may have abdominal, leg, and back pain, sleeping problems, and an increased need to urinate.  During the third trimester your breasts will keep growing and they will continue to become tender. A yellow fluid (colostrum) may leak from your breasts. This is the first milk you are producing for your baby.  False labor is a condition in which you feel small, irregular tightenings of the muscles in the womb (contractions) that eventually go away. These are called Braxton Hicks contractions. Contractions may last for hours, days, or even weeks before true labor sets in.  Signs of labor can include: abdominal cramps; regular contractions that start at 10 minutes apart and become stronger and more frequent with time; watery or bloody mucus discharge that comes from the vagina; increased pelvic pressure and dull back pain; and leaking of amniotic fluid. This information is not intended to replace advice  given to you by your health care provider. Make sure you discuss any questions you have with your health care provider. Document Released: 08/20/2001 Document Revised: 02/01/2016 Document Reviewed: 10/27/2012 [employer] Interactive Patient Education  2017 [employer]  

## 2017-04-10 NOTE — MAU Provider Note (Signed)
History     CSN: [id number]  Arrival date and time: 04/10/17 1918   None     Chief Complaint  Patient presents with  . Decreased Fetal Movement   HPI   [name] is a 21 y.o. female G1P0 @ 15w2dwith a history of migraine without aura, morbid obesity, and idiopathic intracranial hypertension. She is currently being followed by her neurologist Dr. HGaynell Face States she has not felt the babe move since last night. States she called the office and they recommended she try drinking something cold. She tried their recommendations and she still did not feel the baby move. Since her arrival here to MAU and since the monitor has been on she has felt the baby move.  Patient saw her neurologist this week and had an eye exam. She was told that everything is ok from both.   OB History    Gravida Para Term Preterm AB Living   1             SAB TAB Ectopic Multiple Live Births                  Past Medical History:  Diagnosis Date  . Family history of adverse reaction to anesthesia    grandmother is hard to wake up  . History of bronchitis as a child   . History of kidney stones 10/2015  . Migraines    takes Propranolol daily and Zanaflex as needed  . Nausea    takes Zofran as needed  . Pneumonia    as a baby   . PONV (postoperative nausea and vomiting)   . Pseudotumor cerebri     Past Surgical History:  Procedure Laterality Date  . CHOLECYSTECTOMY N/A 12/20/2015   Procedure: LAPAROSCOPIC CHOLECYSTECTOMY;  Surgeon: DCoralie Keens MD;  [facility]  Service: General;  Laterality: N/A;  . TONSILLECTOMY AND ADENOIDECTOMY     89or 21years old     Family History  Problem Relation Age of Onset  . Lung cancer Maternal Grandfather        Died at 425   Social History  Substance Use Topics  . Smoking status: Never Smoker  . Smokeless tobacco: Never Used     Comment: Mom and step dad smoke outside  . Alcohol use No    Allergies:  Allergies  Allergen Reactions   . Chlorhexidine Itching, Rash and Other (See Comments)    Reaction:  Burning   . Dust Mite Mixed Allergen Ext [Mite (D. Farinae)] Itching    Prescriptions Prior to Admission  Medication Sig Dispense Refill Last Dose  . acetaminophen (TYLENOL) 500 MG tablet Take 1,000 mg by mouth every 6 (six) hours as needed for mild pain, moderate pain, fever or headache.    04/10/2017 at Unknown time  . ondansetron (ZOFRAN) 8 MG tablet Take 8 mg by mouth every 8 (eight) hours as needed for nausea or vomiting.   04/09/2017 at Unknown time  . ondansetron (ZOFRAN-ODT) 8 MG disintegrating tablet Take 8 mg by mouth every 8 (eight) hours as needed for nausea or vomiting.    04/09/2017 at Unknown time  . Prenatal Vit-Fe Fumarate-FA (PRENATAL MULTIVITAMIN) TABS tablet Take 1 tablet by mouth at bedtime.    04/09/2017 at Unknown time  . promethazine (PHENERGAN) 25 MG tablet Take 0.5-1 tablets (12.5-25 mg total) by mouth every 6 (six) hours as needed. (Patient taking differently: Take 12.5-25 mg by mouth every 6 (six) hours as needed  for nausea or vomiting. ) 30 tablet 2 Past Month at Unknown time  . ranitidine (ZANTAC) 150 MG tablet Take 150 mg by mouth 2 (two) times daily.   04/10/2017 at Unknown time   Results for orders placed or performed during the hospital encounter of 04/10/17 (from the past 48 hour(s))  Protein / creatinine ratio, urine     Status: Abnormal   Collection Time: 04/10/17  7:24 PM  Result Value Ref Range   Creatinine, Urine 245.00 mg/dL   Total Protein, Urine 44 mg/dL    Comment: NO NORMAL RANGE ESTABLISHED FOR THIS TEST   Protein Creatinine Ratio 0.18 (H) 0.00 - 0.15 mg/mg[Cre]  CBC     Status: Abnormal   Collection Time: 04/10/17  8:18 PM  Result Value Ref Range   WBC 8.6 4.0 - 10.5 K/uL   RBC 4.24 3.87 - 5.11 MIL/uL   Hemoglobin 11.1 (L) 12.0 - 15.0 g/dL   HCT 34.1 (L) 36.0 - 46.0 %   MCV 80.4 78.0 - 100.0 fL   MCH 26.2 26.0 - 34.0 pg   MCHC 32.6 30.0 - 36.0 g/dL   RDW 14.1 11.5 - 15.5 %    Platelets 280 150 - 400 K/uL  Comprehensive metabolic panel     Status: Abnormal   Collection Time: 04/10/17  8:18 PM  Result Value Ref Range   Sodium 136 135 - 145 mmol/L   Potassium 3.5 3.5 - 5.1 mmol/L   Chloride 105 101 - 111 mmol/L   CO2 22 22 - 32 mmol/L   Glucose, Bld 118 (H) 65 - 99 mg/dL   BUN 8 6 - 20 mg/dL   Creatinine, Ser 0.54 0.44 - 1.00 mg/dL   Calcium 9.3 8.9 - 10.3 mg/dL   Total Protein 7.0 6.5 - 8.1 g/dL   Albumin 2.8 (L) 3.5 - 5.0 g/dL   AST 22 15 - 41 U/L   ALT 32 14 - 54 U/L   Alkaline Phosphatase 84 38 - 126 U/L   Total Bilirubin 0.5 0.3 - 1.2 mg/dL   GFR calc non Af Amer >60 >60 mL/min   GFR calc Af Amer >60 >60 mL/min    Comment: (NOTE) The eGFR has been calculated using the CKD EPI equation. This calculation has not been validated in all clinical situations. eGFR's persistently <60 mL/min signify possible Chronic Kidney Disease.    Anion gap 9 5 - 15  Uric acid     Status: None   Collection Time: 04/10/17  8:18 PM  Result Value Ref Range   Uric Acid, Serum 3.6 2.3 - 6.6 mg/dL  Lactate dehydrogenase     Status: None   Collection Time: 04/10/17  8:18 PM  Result Value Ref Range   LDH 103 98 - 192 U/L   Review of Systems  Eyes: Negative for photophobia and visual disturbance.  Gastrointestinal: Negative for abdominal pain.  Genitourinary: Negative for vaginal bleeding.  Neurological: Negative for headaches.   Physical Exam   Blood pressure (!) 148/76, pulse 95, temperature 98.4 F (36.9 C), temperature source Oral, resp. rate 20, height 5' 5.5" (1.664 m), weight (!) 320 lb (145.2 kg), last menstrual period 03/18/2016, SpO2 100 %. No results found.   Patient Vitals for the past 24 hrs:  BP Temp Temp src Pulse Resp SpO2 Height Weight  04/10/17 2100 130/81 - - 84 - - - -  04/10/17 2046 132/82 - - 97 - - - -  04/10/17 2030 138/76 - - 100 - - - -  04/10/17 2017 134/75 - - 94 - - - -  04/10/17 2001 (!) 148/76 - - 95 - - - -  04/10/17 1951 (!)  143/78 - - 100 - - - -  04/10/17 1930 (!) 136/91 98.4 F (36.9 C) Oral 90 20 100 % 5' 5.5" (1.664 m) (!) 320 lb (145.2 kg)    Physical Exam  Constitutional: She is oriented to person, place, and time. She appears well-developed and well-nourished. No distress.  HENT:  Head: Normocephalic.  Eyes: Pupils are equal, round, and reactive to light.  Cardiovascular: Normal rate.   Respiratory: Effort normal and breath sounds normal.  GI: Soft. She exhibits no distension. There is no tenderness. There is no rebound.  Neurological: She is alert and oriented to person, place, and time. She has normal reflexes. She displays normal reflexes.  Negative clonus   Skin: Skin is warm. She is not diaphoretic.  Psychiatric: Her behavior is normal.   Fetal Tracing: Baseline: 145 bpm Variability: Moderate  Accelerations: 15x15 Decelerations: none Toco: quiet   MAU Course  Procedures  None  MDM  First 3 intake BP's elevated; subsequent BP's WNL  PIH labs WNL NST; reactive.  + fetal movement current, patient feels reassured.  Discussed patient with Dr. Matthew Saras, ok for discharge home.   Assessment and Plan   A:  1. Elevated blood pressure affecting pregnancy, antepartum   2. Decreased fetal movements in second trimester, single or unspecified fetus   3. NST (non-stress test) reactive     P:  Discharge home in stable condition Return to MAU if symptoms worsen Discussed fetal kick counts at home Follow up with Dr. Lynnette Caffey next Thursday as scheduled.  Preeclampsia precautions   Rasch, Artist Pais, NP 04/10/2017 9:20 PM

## 2017-04-10 NOTE — MAU Note (Signed)
Urine in lab 

## 2017-04-10 NOTE — MAU Note (Signed)
Pt reports not feeling any fetal movement since last night. States she tried drinking cold water, eating, lying on side and still has not felt movement. Pt states she called office and was told to come in. FHT in triage 150. Pt denies vaginal bleeding or LOF. Reports some lower back pain-rates 5/10

## 2017-04-19 ENCOUNTER — Inpatient Hospital Stay (HOSPITAL_COMMUNITY)
Admission: AD | Admit: 2017-04-19 | Discharge: 2017-04-19 | Disposition: A | Discharge status: Home / Self Care | Payer: Managed Care, Other (non HMO) | Source: Ambulatory Visit | Attending: Obstetrics and Gynecology | Admitting: Obstetrics and Gynecology

## 2017-04-19 ENCOUNTER — Encounter (HOSPITAL_COMMUNITY): Payer: Self-pay | Admitting: *Deleted

## 2017-04-19 DIAGNOSIS — O163 Unspecified maternal hypertension, third trimester: Secondary | ICD-10-CM | POA: Diagnosis not present

## 2017-04-19 DIAGNOSIS — B373 Candidiasis of vulva and vagina: Secondary | ICD-10-CM | POA: Diagnosis not present

## 2017-04-19 DIAGNOSIS — O9989 Other specified diseases and conditions complicating pregnancy, childbirth and the puerperium: Secondary | ICD-10-CM

## 2017-04-19 DIAGNOSIS — Z3A29 29 weeks gestation of pregnancy: Secondary | ICD-10-CM | POA: Insufficient documentation

## 2017-04-19 DIAGNOSIS — Z3689 Encounter for other specified antenatal screening: Secondary | ICD-10-CM

## 2017-04-19 DIAGNOSIS — O98813 Other maternal infectious and parasitic diseases complicating pregnancy, third trimester: Secondary | ICD-10-CM | POA: Insufficient documentation

## 2017-04-19 DIAGNOSIS — R109 Unspecified abdominal pain: Secondary | ICD-10-CM

## 2017-04-19 DIAGNOSIS — B3731 Acute candidiasis of vulva and vagina: Secondary | ICD-10-CM

## 2017-04-19 DIAGNOSIS — N898 Other specified noninflammatory disorders of vagina: Secondary | ICD-10-CM | POA: Diagnosis present

## 2017-04-19 DIAGNOSIS — O133 Gestational [pregnancy-induced] hypertension without significant proteinuria, third trimester: Secondary | ICD-10-CM | POA: Diagnosis not present

## 2017-04-19 DIAGNOSIS — O26893 Other specified pregnancy related conditions, third trimester: Secondary | ICD-10-CM | POA: Diagnosis present

## 2017-04-19 HISTORY — DX: Essential (primary) hypertension: I10

## 2017-04-19 LAB — COMPREHENSIVE METABOLIC PANEL
ALT: 34 U/L (ref 14–54)
AST: 22 U/L (ref 15–41)
Albumin: 2.9 g/dL — ABNORMAL LOW (ref 3.5–5.0)
Alkaline Phosphatase: 90 U/L (ref 38–126)
Anion gap: 10 (ref 5–15)
BUN: 9 mg/dL (ref 6–20)
CO2: 22 mmol/L (ref 22–32)
Calcium: 9.1 mg/dL (ref 8.9–10.3)
Chloride: 104 mmol/L (ref 101–111)
Creatinine, Ser: 0.54 mg/dL (ref 0.44–1.00)
GFR calc Af Amer: >60 mL/min (ref >60)
GFR calc non Af Amer: >60 mL/min (ref >60)
Glucose, Bld: 129 mg/dL — ABNORMAL HIGH (ref 65–99)
Potassium: 3.8 mmol/L (ref 3.5–5.1)
Sodium: 136 mmol/L (ref 135–145)
Total Bilirubin: 0.2 mg/dL — ABNORMAL LOW (ref 0.3–1.2)
Total Protein: 6.8 g/dL (ref 6.5–8.1)

## 2017-04-19 LAB — WET PREP, GENITAL
Clue Cells Wet Prep HPF POC: NONE SEEN
Sperm: NONE SEEN
Trich, Wet Prep: NONE SEEN

## 2017-04-19 LAB — CBC
HCT: 33.1 % — ABNORMAL LOW (ref 36.0–46.0)
Hemoglobin: 10.9 g/dL — ABNORMAL LOW (ref 12.0–15.0)
MCH: 26.3 pg (ref 26.0–34.0)
MCHC: 32.9 g/dL (ref 30.0–36.0)
MCV: 79.8 fL (ref 78.0–100.0)
Platelets: 281 10*3/uL (ref 150–400)
RBC: 4.15 MIL/uL (ref 3.87–5.11)
RDW: 13.8 % (ref 11.5–15.5)
WBC: 10.1 10*3/uL (ref 4.0–10.5)

## 2017-04-19 LAB — URINALYSIS, ROUTINE W REFLEX MICROSCOPIC
Bilirubin Urine: NEGATIVE
Glucose, UA: NEGATIVE mg/dL
Hgb urine dipstick: NEGATIVE
Ketones, ur: 80 mg/dL — AB
Nitrite: NEGATIVE
Protein, ur: 30 mg/dL — AB
Specific Gravity, Urine: 1.028 (ref 1.005–1.030)
pH: 5 (ref 5.0–8.0)

## 2017-04-19 LAB — PROTEIN / CREATININE RATIO, URINE
Creatinine, Urine: 274 mg/dL
Protein Creatinine Ratio: 0.23 mg/mg{Cre} — ABNORMAL HIGH (ref 0.00–0.15)
Total Protein, Urine: 63 mg/dL

## 2017-04-19 LAB — POCT FERN TEST: POCT Fern Test: NEGATIVE

## 2017-04-19 MED ORDER — TERCONAZOLE 0.4 % VA CREA: 1.0000 | TOPICAL_CREAM | Freq: Every day | VAGINAL | 0 refills | Status: DC

## 2017-04-19 MED ORDER — LABETALOL HCL 200 MG PO TABS
200.0000 mg | ORAL_TABLET | Freq: Two times a day (BID) | ORAL | 0 refills | Status: DC
Start: 2017-04-19 — End: 2017-06-10

## 2017-04-19 NOTE — Discharge Instructions (Signed)
Vaginal Yeast infection, Adult °Vaginal yeast infection is a condition that causes soreness, swelling, and redness (inflammation) of the vagina. It also causes vaginal discharge. This is a common condition. Some women get this infection frequently. °What are the causes? °This condition is caused by a change in the normal balance of the yeast (candida) and bacteria that live in the vagina. This change causes an overgrowth of yeast, which causes the inflammation. °What increases the risk? °This condition is more likely to develop in: °· Women who take antibiotic medicines. °· Women who have diabetes. °· Women who take birth control pills. °· Women who are pregnant. °· Women who douche often. °· Women who have a weak defense (immune) system. °· Women who have been taking steroid medicines for a long time. °· Women who frequently wear tight clothing. ° °What are the signs or symptoms? °Symptoms of this condition include: °· White, thick vaginal discharge. °· Swelling, itching, redness, and irritation of the vagina. The lips of the vagina (vulva) may be affected as well. °· Pain or a burning feeling while urinating. °· Pain during sex. ° °How is this diagnosed? °This condition is diagnosed with a medical history and physical exam. This will include a pelvic exam. Your health care provider will examine a sample of your vaginal discharge under a microscope. Your health care provider may send this sample for testing to confirm the diagnosis. °How is this treated? °This condition is treated with medicine. Medicines may be over-the-counter or prescription. You may be told to use one or more of the following: °· Medicine that is taken orally. °· Medicine that is applied as a cream. °· Medicine that is inserted directly into the vagina (suppository). ° °Follow these instructions at home: °· Take or apply over-the-counter and prescription medicines only as told by your health care provider. °· Do not have sex until your health  care provider has approved. Tell your sex partner that you have a yeast infection. That person should go to his or her health care provider if he or she develops symptoms. °· Do not wear tight clothes, such as pantyhose or tight pants. °· Avoid using tampons until your health care provider approves. °· Eat more yogurt. This may help to keep your yeast infection from returning. °· Try taking a sitz bath to help with discomfort. This is a warm water bath that is taken while you are sitting down. The water should only come up to your hips and should cover your buttocks. Do this 3-4 times per day or as told by your health care provider. °· Do not douche. °· Wear breathable, cotton underwear. °· If you have diabetes, keep your blood sugar levels under control. °Contact a health care provider if: °· You have a fever. °· Your symptoms go away and then return. °· Your symptoms do not get better with treatment. °· Your symptoms get worse. °· You have new symptoms. °· You develop blisters in or around your vagina. °· You have blood coming from your vagina and it is not your menstrual period. °· You develop pain in your abdomen. °This information is not intended to replace advice given to you by your health care provider. Make sure you discuss any questions you have with your health care provider. °Document Released: 06/05/2005 Document Revised: 02/07/2016 Document Reviewed: 02/27/2015 °Elsevier Interactive Patient Education © 2018 [employer] ° ° °Hypertension During Pregnancy °Hypertension, commonly called high blood pressure, is when the force of blood pumping through your arteries   is too strong. Arteries are blood vessels that carry blood from the heart throughout the body. Hypertension during pregnancy can cause problems for you and your baby. Your baby may be born early (prematurely) or may not weigh as much as he or she should at birth. Very bad cases of hypertension during pregnancy can be  life-threatening. °Different types of hypertension can occur during pregnancy. These include: °· Chronic hypertension. This happens when: °? You have hypertension before pregnancy and it continues during pregnancy. °? You develop hypertension before you are [redacted] weeks pregnant, and it continues during pregnancy. °· Gestational hypertension. This is hypertension that develops after the 20th week of pregnancy. °· Preeclampsia, also called toxemia of pregnancy. This is a very serious type of hypertension that develops only during pregnancy. It affects the whole body, and it can be very dangerous for you and your baby. ° °Gestational hypertension and preeclampsia usually go away within 6 weeks after your baby is born. Women who have hypertension during pregnancy have a greater chance of developing hypertension later in life or during future pregnancies. °What are the causes? °The exact cause of hypertension is not known. °What increases the risk? °There are certain factors that make it more likely for you to develop hypertension during pregnancy. These include: °· Having hypertension during a previous pregnancy or prior to pregnancy. °· Being overweight. °· Being older than age 40. °· Being pregnant for the first time or being pregnant with more than one baby. °· Becoming pregnant using fertilization methods such as IVF (in vitro fertilization). °· Having diabetes, kidney problems, or systemic lupus erythematosus. °· Having a family history of hypertension. ° °What are the signs or symptoms? °Chronic hypertension and gestational hypertension rarely cause symptoms. Preeclampsia causes symptoms, which may include: °· Increased protein in your urine. Your health care provider will check for this at every visit before you give birth (prenatal visit). °· Severe headaches. °· Sudden weight gain. °· Swelling of the hands, face, legs, and feet. °· Nausea and vomiting. °· Vision problems, such as blurred or double  vision. °· Numbness in the face, arms, legs, and feet. °· Dizziness. °· Slurred speech. °· Sensitivity to bright lights. °· Abdominal pain. °· Convulsions. ° °How is this diagnosed? °You may be diagnosed with hypertension during a routine prenatal exam. At each prenatal visit, you may: °· Have a urine test to check for high amounts of protein in your urine. °· Have your blood pressure checked. A blood pressure reading is recorded as two numbers, such as "120 over 80" (or 120/80). The first ("top") number is called the systolic pressure. It is a measure of the pressure in your arteries when your heart beats. The second ("bottom") number is called the diastolic pressure. It is a measure of the pressure in your arteries as your heart relaxes between beats. Blood pressure is measured in a unit called mm Hg. A normal blood pressure reading is: °? Systolic: below 120. °? Diastolic: below 80. ° °The type of hypertension that you are diagnosed with depends on your test results and when your symptoms developed. °· Chronic hypertension is usually diagnosed before 20 weeks of pregnancy. °· Gestational hypertension is usually diagnosed after 20 weeks of pregnancy. °· Hypertension with high amounts of protein in the urine is diagnosed as preeclampsia. °· Blood pressure measurements that stay above 160 systolic, or above 110 diastolic, are signs of severe preeclampsia. ° °How is this treated? °Treatment for hypertension during pregnancy varies depending on the   type of hypertension you have and how serious it is. °· If you take medicines called ACE inhibitors to treat chronic hypertension, you may need to switch medicines. ACE inhibitors should not be taken during pregnancy. °· If you have gestational hypertension, you may need to take blood pressure medicine. °· If you are at risk for preeclampsia, your health care provider may recommend that you take a low-dose aspirin every day to prevent high blood pressure during your  pregnancy. °· If you have severe preeclampsia, you may need to be hospitalized so you and your baby can be monitored closely. You may also need to take medicine (magnesium sulfate) to prevent seizures and to lower blood pressure. This medicine may be given as an injection or through an IV tube. °· In some cases, if your condition gets worse, you may need to deliver your baby early. ° °Follow these instructions at home: °Eating and drinking °· Drink enough fluid to keep your urine clear or pale yellow. °· Eat a healthy diet that is low in salt (sodium). Do not add salt to your food. Check food labels to see how much sodium a food or beverage contains. °Lifestyle °· Do not use any products that contain nicotine or tobacco, such as cigarettes and e-cigarettes. If you need help quitting, ask your health care provider. °· Do not use alcohol. °· Avoid caffeine. °· Avoid stress as much as possible. Rest and get plenty of sleep. °General instructions °· Take over-the-counter and prescription medicines only as told by your health care provider. °· While lying down, lie on your left side. This keeps pressure off your baby. °· While sitting or lying down, raise (elevate) your feet. Try putting some pillows under your lower legs. °· Exercise regularly. Ask your health care provider what kinds of exercise are best for you. °· Keep all prenatal and follow-up visits as told by your health care provider. This is important. °Contact a health care provider if: °· You have symptoms that your health care provider told you may require more treatment or monitoring, such as: °? Fever. °? Vomiting. °? Headache. °Get help right away if: °· You have severe abdominal pain or vomiting that does not get better with treatment. °· You suddenly develop swelling in your hands, ankles, or face. °· You gain 4 lbs (1.8 kg) or more in 1 week. °· You develop vaginal bleeding, or you have blood in your urine. °· You do not feel your baby moving as much  as usual. °· You have blurred or double vision. °· You have muscle twitching or sudden tightening (spasms). °· You have shortness of breath. °· Your lips or fingernails turn blue. °This information is not intended to replace advice given to you by your health care provider. Make sure you discuss any questions you have with your health care provider. °Document Released: 05/14/2011 Document Revised: 03/15/2016 Document Reviewed: 02/09/2016 °Elsevier Interactive Patient Education © 2018 [employer] ° °

## 2017-04-19 NOTE — MAU Provider Note (Signed)
History     CSN: [id number]  Arrival date and time: 04/19/17 1548   First Provider Initiated Contact with Patient 04/19/17 1634      Chief Complaint  Patient presents with  . Abdominal Pain  . Back Pain  . Vaginal Discharge   G1 @29 .4 wks here with vaginal discharge and cramping. Sx started around 1500 today. Discharge was thick and white and underwear was wet around it. No gush of fluid. No VB. Cramping is upper and lower abdomen and intermittent. Rates pain 5/10. Has not taken anything for it. Reports good FM. Denies HA, visual disturbances, and epigastric pain. Pregnancy has been complicated by HTN, obesity, pseudotumor cerebri and migraines.    OB History    Gravida Para Term Preterm AB Living   1             SAB TAB Ectopic Multiple Live Births                  Past Medical History:  Diagnosis Date  . Family history of adverse reaction to anesthesia    grandmother is hard to wake up  . History of bronchitis as a child   . History of kidney stones 10/2015  . Hypertension    "since preg has been higher"  . Migraines    takes Propranolol daily and Zanaflex as needed  . Nausea    takes Zofran as needed  . Ovarian cyst   . Pneumonia    as a baby   . PONV (postoperative nausea and vomiting)    hard to wake up  . Pseudotumor cerebri     Past Surgical History:  Procedure Laterality Date  . CHOLECYSTECTOMY N/A 12/20/2015   Procedure: LAPAROSCOPIC CHOLECYSTECTOMY;  Surgeon: Abigail Miyamoto, MD;  Location: [facility] OR;  Service: General;  Laterality: N/A;  . TONSILLECTOMY AND ADENOIDECTOMY     4 or 21 years old   . TONSILLECTOMY AND ADENOIDECTOMY      Family History  Problem Relation Age of Onset  . Lung cancer Maternal Grandfather        Died at 74  . Cancer Maternal Grandfather   . Hypertension Mother   . Diabetes Sister   . Heart disease Maternal Grandmother   . Diabetes Maternal Aunt   . Diabetes Maternal Uncle   . Cancer Maternal Uncle     Social History   Substance Use Topics  . Smoking status: Former Smoker    Types: Cigarettes  . Smokeless tobacco: Never Used     Comment: Mom and step dad smoke outside, pt quit age 42  . Alcohol use No    Allergies:  Allergies  Allergen Reactions  . Chlorhexidine Itching, Rash and Other (See Comments)    Reaction:  Burning   . Dust Mite Mixed Allergen Ext [Mite (D. Farinae)] Itching    Prescriptions Prior to Admission  Medication Sig Dispense Refill Last Dose  . acetaminophen (TYLENOL) 500 MG tablet Take 1,000 mg by mouth every 6 (six) hours as needed for mild pain, moderate pain, fever or headache.    04/19/2017 at 1230  . doxylamine, Sleep, (UNISOM) 25 MG tablet Take 25 mg by mouth at bedtime as needed for sleep.   04/18/2017 at Unknown time  . ondansetron (ZOFRAN) 8 MG tablet Take 8 mg by mouth every 8 (eight) hours as needed for nausea or vomiting.   04/19/2017 at Unknown time  . ondansetron (ZOFRAN-ODT) 8 MG disintegrating tablet Take 8 mg by mouth every 8 (  eight) hours as needed for nausea or vomiting.    04/18/2017 at Unknown time  . Prenatal Vit-Fe Fumarate-FA (PRENATAL MULTIVITAMIN) TABS tablet Take 1 tablet by mouth at bedtime.    04/18/2017 at Unknown time  . ranitidine (ZANTAC) 150 MG tablet Take 150 mg by mouth 2 (two) times daily.   04/19/2017 at Unknown time    Review of Systems  Constitutional: Negative for chills and fever.  Eyes: Negative for visual disturbance.  Gastrointestinal: Positive for abdominal pain.  Genitourinary: Positive for vaginal discharge. Negative for dysuria, frequency, urgency and vaginal bleeding.  Neurological: Negative for headaches.   Physical Exam   Blood pressure (!) 156/90, pulse (!) 103, temperature 98.3 F (36.8 C), temperature source Oral, resp. rate 18, weight (!) 319 lb (144.7 kg), last menstrual period 03/18/2016, SpO2 100 %.  Patient Vitals for the past 24 hrs:  BP Temp Temp src Pulse Resp SpO2 Weight  04/19/17 1717 (!) 156/90 - - (!) 103 - - -   04/19/17 1701 (!) 156/86 - - 97 - - -  04/19/17 1646 (!) 156/82 - - 98 - - -  04/19/17 1637 - - - (!) 101 - - -  04/19/17 1632 (!) 156/94 - - (!) 107 - - -  04/19/17 1625 (!) 151/93 - - (!) 109 - - -  04/19/17 1601 (!) 141/85 98.3 F (36.8 C) Oral 97 18 100 % (!) 319 lb (144.7 kg)    Physical Exam  Nursing note and vitals reviewed. Constitutional: She is oriented to person, place, and time. She appears well-developed and well-nourished. No distress.  HENT:  Head: Normocephalic and atraumatic.  Neck: Normal range of motion.  Cardiovascular: Normal rate.   Respiratory: Effort normal. No respiratory distress.  GI: Soft. She exhibits no distension. There is no tenderness.  gravid  Genitourinary:  Genitourinary Comments: SSE: thick white mucous discharge, no pool, fern neg SVE: closed/thick  Musculoskeletal: Normal range of motion. She exhibits no edema.  Neurological: She is alert and oriented to person, place, and time.  Skin: Skin is warm and dry.  Psychiatric: She has a normal mood and affect.  EFM: 140 bpm, mod variability, + accels, no decels Toco: none  Results for orders placed or performed during the hospital encounter of 04/19/17 (from the past 24 hour(s))  Urinalysis, Routine w reflex microscopic     Status: Abnormal   Collection Time: 04/19/17  4:03 PM  Result Value Ref Range   Color, Urine YELLOW YELLOW   APPearance HAZY (A) CLEAR   Specific Gravity, Urine 1.028 1.005 - 1.030   pH 5.0 5.0 - 8.0   Glucose, UA NEGATIVE NEGATIVE mg/dL   Hgb urine dipstick NEGATIVE NEGATIVE   Bilirubin Urine NEGATIVE NEGATIVE   Ketones, ur 80 (A) NEGATIVE mg/dL   Protein, ur 30 (A) NEGATIVE mg/dL   Nitrite NEGATIVE NEGATIVE   Leukocytes, UA LARGE (A) NEGATIVE   RBC / HPF 6-30 0 - 5 RBC/hpf   WBC, UA 6-30 0 - 5 WBC/hpf   Bacteria, UA RARE (A) NONE SEEN   Squamous Epithelial / LPF 6-30 (A) NONE SEEN   Mucous PRESENT   Wet prep, genital     Status: Abnormal   Collection Time:  04/19/17  4:45 PM  Result Value Ref Range   Yeast Wet Prep HPF POC PRESENT (A) NONE SEEN   Trich, Wet Prep NONE SEEN NONE SEEN   Clue Cells Wet Prep HPF POC NONE SEEN NONE SEEN   WBC, Wet Prep  HPF POC MANY (A) NONE SEEN   Sperm NONE SEEN   Fern Test     Status: Normal   Collection Time: 04/19/17  5:02 PM  Result Value Ref Range   POCT Fern Test Negative = intact amniotic membranes   CBC     Status: Abnormal   Collection Time: 04/19/17  5:09 PM  Result Value Ref Range   WBC 10.1 4.0 - 10.5 K/uL   RBC 4.15 3.87 - 5.11 MIL/uL   Hemoglobin 10.9 (L) 12.0 - 15.0 g/dL   HCT 16.1 (L) 09.6 - 04.5 %   MCV 79.8 78.0 - 100.0 fL   MCH 26.3 26.0 - 34.0 pg   MCHC 32.9 30.0 - 36.0 g/dL   RDW 40.9 81.1 - 91.4 %   Platelets 281 150 - 400 K/uL  Comprehensive metabolic panel     Status: Abnormal   Collection Time: 04/19/17  5:09 PM  Result Value Ref Range   Sodium 136 135 - 145 mmol/L   Potassium 3.8 3.5 - 5.1 mmol/L   Chloride 104 101 - 111 mmol/L   CO2 22 22 - 32 mmol/L   Glucose, Bld 129 (H) 65 - 99 mg/dL   BUN 9 6 - 20 mg/dL   Creatinine, Ser 7.82 0.44 - 1.00 mg/dL   Calcium 9.1 8.9 - 95.6 mg/dL   Total Protein 6.8 6.5 - 8.1 g/dL   Albumin 2.9 (L) 3.5 - 5.0 g/dL   AST 22 15 - 41 U/L   ALT 34 14 - 54 U/L   Alkaline Phosphatase 90 38 - 126 U/L   Total Bilirubin 0.2 (L) 0.3 - 1.2 mg/dL   GFR calc non Af Amer >60 >60 mL/min   GFR calc Af Amer >60 >60 mL/min   Anion gap 10 5 - 15  Protein / creatinine ratio, urine     Status: Abnormal   Collection Time: 04/19/17  5:17 PM  Result Value Ref Range   Creatinine, Urine 274.00 mg/dL   Total Protein, Urine 63 mg/dL   Protein Creatinine Ratio 0.23 (H) 0.00 - 0.15 mg/mg[Cre]    MAU Course  Procedures  MDM Labs ordered and reviewed. No evidence of preeclampsia, PTL, UTI, or ROM. Presentation, clinical findings, and plan discussed with Dr. Henderson Cloud. Will start Labetalol. Stable for discharge home.  Assessment and Plan   1. [redacted] weeks  gestation of pregnancy   2. Pregnancy-induced hypertension in third trimester   3. NST (non-stress test) reactive   4. Yeast vaginitis    Discharge home Follow up in OB office for BP check in 2-3 days Pre-e precautions Rx Labetalol  Allergies as of 04/19/2017      Reactions   Chlorhexidine Itching, Rash, Other (See Comments)   Reaction:  Burning    Dust Mite Mixed Allergen Ext [mite (d. Farinae)] Itching      Medication List    TAKE these medications   acetaminophen 500 MG tablet Commonly known as:  TYLENOL Take 1,000 mg by mouth every 6 (six) hours as needed for mild pain, moderate pain, fever or headache.   doxylamine (Sleep) 25 MG tablet Commonly known as:  UNISOM Take 25 mg by mouth at bedtime as needed for sleep.   labetalol 200 MG tablet Commonly known as:  NORMODYNE Take 1 tablet (200 mg total) by mouth 2 (two) times daily.   ondansetron 8 MG disintegrating tablet Commonly known as:  ZOFRAN-ODT Take 8 mg by mouth every 8 (eight) hours as needed for nausea or  vomiting.   ondansetron 8 MG tablet Commonly known as:  ZOFRAN Take 8 mg by mouth every 8 (eight) hours as needed for nausea or vomiting.   prenatal multivitamin Tabs tablet Take 1 tablet by mouth at bedtime.   ranitidine 150 MG tablet Commonly known as:  ZANTAC Take 150 mg by mouth 2 (two) times daily.   terconazole 0.4 % vaginal cream Commonly known as:  TERAZOL 7 Place 1 applicator vaginally at bedtime.      Donette Larry, CNM 04/19/2017, 6:17 PM

## 2017-04-19 NOTE — MAU Note (Signed)
last night had really cramping. Has had some cramping and back pain today, just not as bad as yesterday- is irregular.  This morning had some really weird d/c. (white and creamy and around it was a clear puddle)

## 2017-04-21 LAB — GC/CHLAMYDIA PROBE AMP (~~LOC~~) NOT AT ARMC
Chlamydia: NEGATIVE
Neisseria Gonorrhea: NEGATIVE

## 2017-05-16 ENCOUNTER — Inpatient Hospital Stay (HOSPITAL_COMMUNITY)
Admission: AD | Admit: 2017-05-16 | Discharge: 2017-05-17 | Disposition: A | Discharge status: Home / Self Care | Payer: Managed Care, Other (non HMO) | Source: Ambulatory Visit | Attending: Obstetrics and Gynecology | Admitting: Obstetrics and Gynecology

## 2017-05-16 DIAGNOSIS — R030 Elevated blood-pressure reading, without diagnosis of hypertension: Secondary | ICD-10-CM | POA: Insufficient documentation

## 2017-05-16 DIAGNOSIS — Z3A33 33 weeks gestation of pregnancy: Secondary | ICD-10-CM | POA: Insufficient documentation

## 2017-05-16 DIAGNOSIS — Z87891 Personal history of nicotine dependence: Secondary | ICD-10-CM | POA: Insufficient documentation

## 2017-05-16 DIAGNOSIS — O26893 Other specified pregnancy related conditions, third trimester: Secondary | ICD-10-CM | POA: Insufficient documentation

## 2017-05-16 DIAGNOSIS — M549 Dorsalgia, unspecified: Secondary | ICD-10-CM | POA: Diagnosis not present

## 2017-05-16 DIAGNOSIS — M545 Low back pain: Secondary | ICD-10-CM | POA: Diagnosis present

## 2017-05-16 DIAGNOSIS — O99891 Other specified diseases and conditions complicating pregnancy: Secondary | ICD-10-CM

## 2017-05-16 DIAGNOSIS — O9989 Other specified diseases and conditions complicating pregnancy, childbirth and the puerperium: Secondary | ICD-10-CM

## 2017-05-16 NOTE — MAU Note (Signed)
Severe back pain since Thurs. Worse tonight. Pain comes and goes and worse at night. Pain is in lower back and tonight some lower abd pain. Leaking fld since 1700 and may be contracting

## 2017-05-17 ENCOUNTER — Other Ambulatory Visit: Payer: Self-pay | Admitting: Certified Nurse Midwife

## 2017-05-17 ENCOUNTER — Encounter (HOSPITAL_COMMUNITY): Payer: Self-pay | Admitting: *Deleted

## 2017-05-17 DIAGNOSIS — M549 Dorsalgia, unspecified: Secondary | ICD-10-CM

## 2017-05-17 DIAGNOSIS — O26893 Other specified pregnancy related conditions, third trimester: Secondary | ICD-10-CM | POA: Diagnosis not present

## 2017-05-17 LAB — URINALYSIS, ROUTINE W REFLEX MICROSCOPIC
Bilirubin Urine: NEGATIVE
Glucose, UA: NEGATIVE mg/dL
Hgb urine dipstick: NEGATIVE
Ketones, ur: NEGATIVE mg/dL
Nitrite: NEGATIVE
Protein, ur: 100 mg/dL — AB
Specific Gravity, Urine: 1.025 (ref 1.005–1.030)
pH: 6 (ref 5.0–8.0)

## 2017-05-17 LAB — WET PREP, GENITAL
Clue Cells Wet Prep HPF POC: NONE SEEN
Sperm: NONE SEEN
Trich, Wet Prep: NONE SEEN
Yeast Wet Prep HPF POC: NONE SEEN

## 2017-05-17 LAB — POCT FERN TEST: POCT Fern Test: NEGATIVE

## 2017-05-17 MED ORDER — CYCLOBENZAPRINE HCL 10 MG PO TABS: 10.0000 mg | ORAL_TABLET | Freq: Two times a day (BID) | ORAL | 0 refills | Status: DC | PRN

## 2017-05-17 MED ORDER — CYCLOBENZAPRINE HCL 10 MG PO TABS
10.0000 mg | ORAL_TABLET | Freq: Once | ORAL | Status: AC
  Administered 2017-05-17: 10 mg via ORAL
  Filled 2017-05-17: qty 1

## 2017-05-17 NOTE — Progress Notes (Signed)
Ok to d/c efm per Heather Hogan CNM 

## 2017-05-17 NOTE — MAU Provider Note (Signed)
History     CSN: [id number]  Arrival date and time: 05/16/17 2315   First Provider Initiated Contact with Patient 05/17/17 0038      Chief Complaint  Patient presents with  . Back Pain  . Pelvic Pain    Melinda Riddle is a 21 y.o. G1P0 at [redacted]w[redacted]d who presents today with back pain and lower abdominal pain for about one week. She states that it has been off and on, but getting worse. She reports that she has tried tylenol, but that does not help her back pain. It does help her headaches. She states that she is followed in this pregnancy for elevated blood pressure. She did not have high blood pressure prior to pregnancy she is currently on Labetalol  BID. She reports that she has had some leaking of fluid. She denies any VB. She reports normal fetal movement.    Back Pain  This is a new problem. The current episode started in the past 7 days. The problem occurs intermittently. The problem is unchanged. The pain is present in the lumbar spine. The pain does not radiate. The pain is worse during the night. Associated symptoms include abdominal pain, headaches ("off and on, none now. Tylenol helps". ) and pelvic pain. Pertinent negatives include no dysuria or fever. Risk factors include pregnancy and obesity. Treatments tried: tylenol  The treatment provided no relief.  Pelvic Pain  The patient's primary symptoms include pelvic pain. This is a new problem. The current episode started in the past 7 days. The problem occurs intermittently. The problem has been unchanged. The problem affects both sides. She is pregnant. Associated symptoms include abdominal pain, back pain and headaches ("off and on, none now. Tylenol helps". ). Pertinent negatives include no chills, dysuria, fever, frequency or urgency. The vaginal discharge was watery. There has been no bleeding. Nothing aggravates the symptoms. She has tried nothing for the symptoms.     Past Medical History:  Diagnosis Date  . Family  history of adverse reaction to anesthesia    grandmother is hard to wake up  . History of bronchitis as a child   . History of kidney stones 10/2015  . Hypertension    "since preg has been higher"  . Migraines    takes Propranolol daily and Zanaflex as needed  . Nausea    takes Zofran as needed  . Ovarian cyst   . Pneumonia    as a baby   . PONV (postoperative nausea and vomiting)    hard to wake up  . Pseudotumor cerebri     Past Surgical History:  Procedure Laterality Date  . CHOLECYSTECTOMY N/A 12/20/2015   Procedure: LAPAROSCOPIC CHOLECYSTECTOMY;  Surgeon: Abigail Miyamoto, MD;  Location: [facility] OR;  Service: General;  Laterality: N/A;  . TONSILLECTOMY AND ADENOIDECTOMY     67 or 21 years old   . TONSILLECTOMY AND ADENOIDECTOMY      Family History  Problem Relation Age of Onset  . Lung cancer Maternal Grandfather        Died at 65  . Cancer Maternal Grandfather   . Hypertension Mother   . Diabetes Sister   . Heart disease Maternal Grandmother   . Diabetes Maternal Aunt   . Diabetes Maternal Uncle   . Cancer Maternal Uncle     Social History  Substance Use Topics  . Smoking status: Former Smoker    Types: Cigarettes  . Smokeless tobacco: Never Used     [id number]: Mom and step  dad smoke outside, pt quit age 21  . Alcohol use No    Allergies:  Allergies  Allergen Reactions  . Chlorhexidine Itching, Rash and Other (See Comments)    Reaction:  Burning   . Dust Mite Mixed Allergen Ext [Mite (D. Farinae)] Itching    Prescriptions Prior to Admission  Medication Sig Dispense Refill Last Dose  . acetaminophen (TYLENOL) 500 MG tablet Take 1,000 mg by mouth every 6 (six) hours as needed for mild pain, moderate pain, fever or headache.    05/16/2017 at 1000  . doxylamine, Sleep, (UNISOM) 25 MG tablet Take 25 mg by mouth at bedtime as needed for sleep.   05/16/2017 at 2200  . labetalol (NORMODYNE) 200 MG tablet Take 1 tablet (200 mg total) by mouth 2 (two) times daily. 60 tablet  0 05/16/2017 at 2200  . ondansetron (ZOFRAN-ODT) 8 MG disintegrating tablet Take 8 mg by mouth every 8 (eight) hours as needed for nausea or vomiting.    05/16/2017 at 1000  . Prenatal Vit-Fe Fumarate-FA (PRENATAL MULTIVITAMIN) TABS tablet Take 1 tablet by mouth at bedtime.    05/16/2017 at Unknown time  . ranitidine (ZANTAC) 150 MG tablet Take 150 mg by mouth 2 (two) times daily.   05/16/2017 at 2200  . ondansetron (ZOFRAN) 8 MG tablet Take 8 mg by mouth every 8 (eight) hours as needed for nausea or vomiting.   04/19/2017 at Unknown time  . terconazole (TERAZOL 7) 0.4 % vaginal cream Place 1 applicator vaginally at bedtime. 45 g 0     Review of Systems  Constitutional: Negative for chills and fever.  Eyes: Negative for visual disturbance.  Gastrointestinal: Positive for abdominal pain.  Genitourinary: Positive for pelvic pain. Negative for dysuria, frequency and urgency.  Musculoskeletal: Positive for back pain.  Neurological: Positive for headaches ("off and on, none now. Tylenol helps". ).   Physical Exam   Blood pressure 137/67, pulse 88, temperature 97.8 F (36.6 C), resp. rate (!) 22, height 5\' 5"  (1.651 m), weight (!) 326 lb (147.9 kg), last menstrual period 03/18/2016.  Physical Exam  Nursing note and vitals reviewed. Constitutional: She is oriented to person, place, and time. She appears well-developed and well-nourished. No distress.  HENT:  Head: Normocephalic.  Cardiovascular: Normal rate.   Respiratory: Effort normal.  GI: Soft. There is no tenderness. There is no rebound.  Genitourinary:  Genitourinary Comments:  External: no lesion Vagina: large amount of white discharge. No pooling Cervix: pink, smooth, no fluid seen with valsalva. Closed/thick  Uterus: AGA   Neurological: She is alert and oriented to person, place, and time.  Skin: Skin is warm and dry.   FHT: 145, moderate with 15x15 accels, no decels Toco: no UCs   Results for orders placed or performed during the  hospital encounter of 05/16/17 (from the past 24 hour(s))  Urinalysis, Routine w reflex microscopic     Status: Abnormal   Collection Time: 05/16/17 11:55 PM  Result Value Ref Range   Color, Urine YELLOW YELLOW   APPearance HAZY (A) CLEAR   Specific Gravity, Urine 1.025 1.005 - 1.030   pH 6.0 5.0 - 8.0   Glucose, UA NEGATIVE NEGATIVE mg/dL   Hgb urine dipstick NEGATIVE NEGATIVE   Bilirubin Urine NEGATIVE NEGATIVE   Ketones, ur NEGATIVE NEGATIVE mg/dL   Protein, ur 604100 (A) NEGATIVE mg/dL   Nitrite NEGATIVE NEGATIVE   Leukocytes, UA MODERATE (A) NEGATIVE   RBC / HPF 0-5 0 - 5 RBC/hpf   WBC, UA  6-30 0 - 5 WBC/hpf   Bacteria, UA RARE (A) NONE SEEN   Squamous Epithelial / LPF 0-5 (A) NONE SEEN   Mucus PRESENT   Wet prep, genital     Status: Abnormal   Collection Time: 05/17/17 12:50 AM  Result Value Ref Range   Yeast Wet Prep HPF POC NONE SEEN NONE SEEN   Trich, Wet Prep NONE SEEN NONE SEEN   Clue Cells Wet Prep HPF POC NONE SEEN NONE SEEN   WBC, Wet Prep HPF POC FEW (A) NONE SEEN   Sperm NONE SEEN   Fern Test     Status: Normal   Collection Time: 05/17/17  1:34 AM  Result Value Ref Range   POCT Fern Test Negative = intact amniotic membranes     MAU Course  Procedures  MDM Patient had 150/96 b/p in triage. Cuff did not fit, and was taken with small cuff on forearm. Since being in her room with a proper fitting cuff B/P has been 130/70s 0149: DW Dr. Rana Snare, ok for DC home Patient has had flexeril. She reports that her back is feeling better.   Assessment and Plan   1. Back pain affecting pregnancy in third trimester    DC home Comfort measures reviewed  3rd Trimester precautions  PTL precautions  Fetal kick counts RX: flexeril PRN #20  Return to MAU as needed FU with OB as planned  Follow-up Information    Candice Camp, MD Follow up.   Specialty:  Obstetrics and Gynecology Contact information: [address] [place], [address] [place] [address] [phone]             Thressa Sheller 05/17/2017, 12:41 AM

## 2017-05-17 NOTE — Progress Notes (Signed)
Written and verbal d/c instructions given and understanding voiced. 

## 2017-05-17 NOTE — Progress Notes (Signed)
Wet prep and fern slide done

## 2017-05-17 NOTE — Discharge Instructions (Signed)

## 2017-06-09 ENCOUNTER — Inpatient Hospital Stay (EMERGENCY_DEPARTMENT_HOSPITAL)
Admission: AD | Admit: 2017-06-09 | Discharge: 2017-06-09 | Disposition: A | Discharge status: Home / Self Care | Payer: Managed Care, Other (non HMO) | Source: Ambulatory Visit | Attending: Obstetrics and Gynecology | Admitting: Obstetrics and Gynecology

## 2017-06-09 ENCOUNTER — Encounter (HOSPITAL_COMMUNITY): Payer: Self-pay | Admitting: Student

## 2017-06-09 DIAGNOSIS — O114 Pre-existing hypertension with pre-eclampsia, complicating childbirth: Secondary | ICD-10-CM | POA: Diagnosis not present

## 2017-06-09 DIAGNOSIS — O113 Pre-existing hypertension with pre-eclampsia, third trimester: Secondary | ICD-10-CM | POA: Diagnosis not present

## 2017-06-09 DIAGNOSIS — Z3A36 36 weeks gestation of pregnancy: Secondary | ICD-10-CM

## 2017-06-09 DIAGNOSIS — O119 Pre-existing hypertension with pre-eclampsia, unspecified trimester: Secondary | ICD-10-CM

## 2017-06-09 DIAGNOSIS — O1493 Unspecified pre-eclampsia, third trimester: Secondary | ICD-10-CM | POA: Diagnosis not present

## 2017-06-09 LAB — COMPREHENSIVE METABOLIC PANEL
ALT: 29 U/L (ref 14–54)
AST: 20 U/L (ref 15–41)
Albumin: 2.7 g/dL — ABNORMAL LOW (ref 3.5–5.0)
Alkaline Phosphatase: 125 U/L (ref 38–126)
Anion gap: 6 (ref 5–15)
BUN: 8 mg/dL (ref 6–20)
CO2: 19 mmol/L — ABNORMAL LOW (ref 22–32)
Calcium: 8.9 mg/dL (ref 8.9–10.3)
Chloride: 107 mmol/L (ref 101–111)
Creatinine, Ser: 0.48 mg/dL (ref 0.44–1.00)
GFR calc Af Amer: >60 mL/min (ref >60)
GFR calc non Af Amer: >60 mL/min (ref >60)
Glucose, Bld: 88 mg/dL (ref 65–99)
Potassium: 4 mmol/L (ref 3.5–5.1)
Sodium: 132 mmol/L — ABNORMAL LOW (ref 135–145)
Total Bilirubin: 0.5 mg/dL (ref 0.3–1.2)
Total Protein: 6.2 g/dL — ABNORMAL LOW (ref 6.5–8.1)

## 2017-06-09 LAB — URINALYSIS, ROUTINE W REFLEX MICROSCOPIC
Bilirubin Urine: NEGATIVE
Glucose, UA: NEGATIVE mg/dL
Hgb urine dipstick: NEGATIVE
Ketones, ur: NEGATIVE mg/dL
Nitrite: NEGATIVE
Protein, ur: 100 mg/dL — AB
Specific Gravity, Urine: 1.026 (ref 1.005–1.030)
pH: 5 (ref 5.0–8.0)

## 2017-06-09 LAB — CBC
HCT: 31.9 % — ABNORMAL LOW (ref 36.0–46.0)
Hemoglobin: 10.3 g/dL — ABNORMAL LOW (ref 12.0–15.0)
MCH: 25.4 pg — ABNORMAL LOW (ref 26.0–34.0)
MCHC: 32.3 g/dL (ref 30.0–36.0)
MCV: 78.6 fL (ref 78.0–100.0)
Platelets: 262 10*3/uL (ref 150–400)
RBC: 4.06 MIL/uL (ref 3.87–5.11)
RDW: 13.7 % (ref 11.5–15.5)
WBC: 8.4 10*3/uL (ref 4.0–10.5)

## 2017-06-09 LAB — PROTEIN / CREATININE RATIO, URINE
Creatinine, Urine: 208 mg/dL
Protein Creatinine Ratio: 0.31 mg/mg{Cre} — ABNORMAL HIGH (ref 0.00–0.15)
Total Protein, Urine: 65 mg/dL

## 2017-06-09 NOTE — Discharge Instructions (Signed)
Hypertension During Pregnancy °Hypertension, commonly called high blood pressure, is when the force of blood pumping through your arteries is too strong. Arteries are blood vessels that carry blood from the heart throughout the body. Hypertension during pregnancy can cause problems for you and your baby. Your baby may be born early (prematurely) or may not weigh as much as he or she should at birth. Very bad cases of hypertension during pregnancy can be life-threatening. °Different types of hypertension can occur during pregnancy. These include: °· Chronic hypertension. This happens when: °? You have hypertension before pregnancy and it continues during pregnancy. °? You develop hypertension before you are [redacted] weeks pregnant, and it continues during pregnancy. °· Gestational hypertension. This is hypertension that develops after the 20th week of pregnancy. °· Preeclampsia, also called toxemia of pregnancy. This is a very serious type of hypertension that develops only during pregnancy. It affects the whole body, and it can be very dangerous for you and your baby. ° °Gestational hypertension and preeclampsia usually go away within 6 weeks after your baby is born. Women who have hypertension during pregnancy have a greater chance of developing hypertension later in life or during future pregnancies. °What are the causes? °The exact cause of hypertension is not known. °What increases the risk? °There are certain factors that make it more likely for you to develop hypertension during pregnancy. These include: °· Having hypertension during a previous pregnancy or prior to pregnancy. °· Being overweight. °· Being older than age 40. °· Being pregnant for the first time or being pregnant with more than one baby. °· Becoming pregnant using fertilization methods such as IVF (in vitro fertilization). °· Having diabetes, kidney problems, or systemic lupus erythematosus. °· Having a family history of hypertension. ° °What are the  signs or symptoms? °Chronic hypertension and gestational hypertension rarely cause symptoms. Preeclampsia causes symptoms, which may include: °· Increased protein in your urine. Your health care provider will check for this at every visit before you give birth (prenatal visit). °· Severe headaches. °· Sudden weight gain. °· Swelling of the hands, face, legs, and feet. °· Nausea and vomiting. °· Vision problems, such as blurred or double vision. °· Numbness in the face, arms, legs, and feet. °· Dizziness. °· Slurred speech. °· Sensitivity to bright lights. °· Abdominal pain. °· Convulsions. ° °How is this diagnosed? °You may be diagnosed with hypertension during a routine prenatal exam. At each prenatal visit, you may: °· Have a urine test to check for high amounts of protein in your urine. °· Have your blood pressure checked. A blood pressure reading is recorded as two numbers, such as "120 over 80" (or 120/80). The first ("top") number is called the systolic pressure. It is a measure of the pressure in your arteries when your heart beats. The second ("bottom") number is called the diastolic pressure. It is a measure of the pressure in your arteries as your heart relaxes between beats. Blood pressure is measured in a unit called mm Hg. A normal blood pressure reading is: °? Systolic: below 120. °? Diastolic: below 80. ° °The type of hypertension that you are diagnosed with depends on your test results and when your symptoms developed. °· Chronic hypertension is usually diagnosed before 20 weeks of pregnancy. °· Gestational hypertension is usually diagnosed after 20 weeks of pregnancy. °· Hypertension with high amounts of protein in the urine is diagnosed as preeclampsia. °· Blood pressure measurements that stay above 160 systolic, or above 110 diastolic, are   signs of severe preeclampsia. ° °How is this treated? °Treatment for hypertension during pregnancy varies depending on the type of hypertension you have and how  serious it is. °· If you take medicines called ACE inhibitors to treat chronic hypertension, you may need to switch medicines. ACE inhibitors should not be taken during pregnancy. °· If you have gestational hypertension, you may need to take blood pressure medicine. °· If you are at risk for preeclampsia, your health care provider may recommend that you take a low-dose aspirin every day to prevent high blood pressure during your pregnancy. °· If you have severe preeclampsia, you may need to be hospitalized so you and your baby can be monitored closely. You may also need to take medicine (magnesium sulfate) to prevent seizures and to lower blood pressure. This medicine may be given as an injection or through an IV tube. °· In some cases, if your condition gets worse, you may need to deliver your baby early. ° °Follow these instructions at home: °Eating and drinking °· Drink enough fluid to keep your urine clear or pale yellow. °· Eat a healthy diet that is low in salt (sodium). Do not add salt to your food. Check food labels to see how much sodium a food or beverage contains. °Lifestyle °· Do not use any products that contain nicotine or tobacco, such as cigarettes and e-cigarettes. If you need help quitting, ask your health care provider. °· Do not use alcohol. °· Avoid caffeine. °· Avoid stress as much as possible. Rest and get plenty of sleep. °General instructions °· Take over-the-counter and prescription medicines only as told by your health care provider. °· While lying down, lie on your left side. This keeps pressure off your baby. °· While sitting or lying down, raise (elevate) your feet. Try putting some pillows under your lower legs. °· Exercise regularly. Ask your health care provider what kinds of exercise are best for you. °· Keep all prenatal and follow-up visits as told by your health care provider. This is important. °Contact a health care provider if: °· You have symptoms that your health care  provider told you may require more treatment or monitoring, such as: °? Fever. °? Vomiting. °? Headache. °Get help right away if: °· You have severe abdominal pain or vomiting that does not get better with treatment. °· You suddenly develop swelling in your hands, ankles, or face. °· You gain 4 lbs (1.8 kg) or more in 1 week. °· You develop vaginal bleeding, or you have blood in your urine. °· You do not feel your baby moving as much as usual. °· You have blurred or double vision. °· You have muscle twitching or sudden tightening (spasms). °· You have shortness of breath. °· Your lips or fingernails turn blue. °This information is not intended to replace advice given to you by your health care provider. Make sure you discuss any questions you have with your health care provider. °Document Released: 05/14/2011 Document Revised: 03/15/2016 Document Reviewed: 02/09/2016 °Elsevier Interactive Patient Education © 2018 [employer] ° °

## 2017-06-09 NOTE — MAU Note (Signed)
Past 3 days has been having a headache, today when woke up was worse.  Today her heart kept beating really fast, could see it beating out of chest (no longer feeling it).  Stomach has been hurting, states lower- but points to mid.  Has been nauseated. Marland Kitchen  Has been having hot flashes, when they come she gets dizzy.  BP was elevated but not bad., is on BP meds.

## 2017-06-09 NOTE — H&P (Signed)
Melinda Riddle is a 21 y.o. female presenting for IOL at term. Pregnancy complicated by hypertension on labetalol  po bid. HA on and off. On 06/09/17 C/O HA x 3 days with eye pain. No visual changes. Labs noted P:C of .31. OB History    Gravida Para Term Preterm AB Living   1             SAB TAB Ectopic Multiple Live Births                 Past Medical History:  Diagnosis Date  . Family history of adverse reaction to anesthesia    grandmother is hard to wake up  . History of bronchitis as a child   . History of kidney stones 10/2015  . Hypertension    "since preg has been higher"  . Migraines    takes Propranolol daily and Zanaflex as needed  . PONV (postoperative nausea and vomiting)    hard to wake up  . Pseudotumor cerebri    Past Surgical History:  Procedure Laterality Date  . CHOLECYSTECTOMY N/A 12/20/2015   Procedure: LAPAROSCOPIC CHOLECYSTECTOMY;  Surgeon: Abigail Miyamoto, MD;  Location: [facility] OR;  Service: General;  Laterality: N/A;  . TONSILLECTOMY AND ADENOIDECTOMY     45 or 21 years old    Family History: family history includes Cancer in her maternal grandfather and maternal uncle; Diabetes in her maternal aunt, maternal uncle, and sister; Heart disease in her maternal grandmother; Hypertension in her mother; Lung cancer in her maternal grandfather. Social History:  reports that she has quit smoking. Her smoking use included Cigarettes. She has never used smokeless tobacco. She reports that she does not drink alcohol or use drugs.     Maternal Diabetes: No Genetic Screening: Normal Maternal Ultrasounds/Referrals: Normal Fetal Ultrasounds or other Referrals:  None Maternal Substance Abuse:  No Significant Maternal Medications:  Meds include: Other: labetalol Significant Maternal Lab Results:  None Other Comments:  None  Review of Systems  Eyes: Positive for pain.  Gastrointestinal: Negative for abdominal pain.  Neurological: Positive for headaches.    History   Last menstrual period 03/18/2016. Maternal Exam:  Abdomen: Fetal presentation: vertex     Physical Exam  Cardiovascular: Normal rate and regular rhythm.   Respiratory: Effort normal and breath sounds normal.  Neurological: She has normal reflexes.    Prenatal labs: ABO, Rh: --/--/A NEG (07/18 0531) Antibody: NEG (07/18 0520) Rubella: @ RPR:    HBsAg:    HIV:    GBS:     Assessment/Plan: 21 yo with preeclampsia with sever features Admitted for two stage IOL Magnesium sulfate for seizure prophylaxis   Jotham Ahn II,Pearlena Ow E 06/09/2017, 2:41 PM

## 2017-06-09 NOTE — MAU Provider Note (Signed)
History     CSN: [id number]  Arrival date and time: 06/09/17 1140  First Provider Initiated Contact with Patient 06/09/17 1216      Chief Complaint  Patient presents with  . Headache  . Hypertension   HPI Melinda Riddle is a 21 y.o. G1P0 at [redacted]w[redacted]d who presents from the office for BP evaluation. Patient with chronic hypertension currently taking labetalol 100 mg BID, last dose was this morning. Reports headache since this morning. Initially rated 10/10, took dose of tylenol & is now rating 5/10 & decreasing. Had episode of heart racing & nausea that precipitated 1 episode of vomiting. Symptoms have resolved.  Denies visual disturbance of epigastric pain. Positive fetal movement.   OB History    Gravida Para Term Preterm AB Living   1             SAB TAB Ectopic Multiple Live Births                  Past Medical History:  Diagnosis Date  . Family history of adverse reaction to anesthesia    grandmother is hard to wake up  . History of bronchitis as a child   . History of kidney stones 10/2015  . Hypertension    "since preg has been higher"  . Migraines    takes Propranolol daily and Zanaflex as needed  . PONV (postoperative nausea and vomiting)    hard to wake up  . Pseudotumor cerebri     Past Surgical History:  Procedure Laterality Date  . CHOLECYSTECTOMY N/A 12/20/2015   Procedure: LAPAROSCOPIC CHOLECYSTECTOMY;  Surgeon: Abigail Miyamoto, MD;  Location: [facility] OR;  Service: General;  Laterality: N/A;  . TONSILLECTOMY AND ADENOIDECTOMY     78 or 21 years old     Family History  Problem Relation Age of Onset  . Lung cancer Maternal Grandfather        Died at 23  . Cancer Maternal Grandfather   . Hypertension Mother   . Diabetes Sister   . Heart disease Maternal Grandmother   . Diabetes Maternal Aunt   . Diabetes Maternal Uncle   . Cancer Maternal Uncle     Social History  Substance Use Topics  . Smoking status: Former Smoker    Types: Cigarettes  .  Smokeless tobacco: Never Used     Comment: Mom and step dad smoke outside, pt quit age 35  . Alcohol use No    Allergies:  Allergies  Allergen Reactions  . Chlorhexidine Itching, Rash and Other (See Comments)    Reaction:  Burning   . Dust Mite Mixed Allergen Ext [Mite (D. Farinae)] Itching    Prescriptions Prior to Admission  Medication Sig Dispense Refill Last Dose  . cyclobenzaprine (FLEXERIL) 10 MG tablet Take 1 tablet (10 mg total) by mouth 2 (two) times daily as needed for muscle spasms. 20 tablet 0   . doxylamine, Sleep, (UNISOM) 25 MG tablet Take 25 mg by mouth at bedtime as needed for sleep.   05/16/2017 at 2200  . labetalol (NORMODYNE) 200 MG tablet Take 1 tablet (200 mg total) by mouth 2 (two) times daily. 60 tablet 0 05/16/2017 at 2200  . ondansetron (ZOFRAN) 8 MG tablet Take 8 mg by mouth every 8 (eight) hours as needed for nausea or vomiting.   04/19/2017 at Unknown time  . ondansetron (ZOFRAN-ODT) 8 MG disintegrating tablet Take 8 mg by mouth every 8 (eight) hours as needed for nausea or vomiting.  05/16/2017 at 1000  . Prenatal Vit-Fe Fumarate-FA (PRENATAL MULTIVITAMIN) TABS tablet Take 1 tablet by mouth at bedtime.    05/16/2017 at Unknown time  . ranitidine (ZANTAC) 150 MG tablet Take 150 mg by mouth 2 (two) times daily.   05/16/2017 at 2200    Review of Systems  Eyes: Negative for visual disturbance.  Respiratory: Negative.   Gastrointestinal: Negative.   Genitourinary: Negative.   Neurological: Positive for headaches. Negative for dizziness.   Physical Exam   Blood pressure (!) 149/80, pulse (!) 124, temperature 97.9 F (36.6 C), temperature source Oral, resp. rate 18, height  (1.651 m), weight (!) 330 lb (149.7 kg), last menstrual period 03/18/2016, SpO2 99 %.  Patient Vitals for the past 24 hrs:  BP Temp Temp src Pulse Resp SpO2 Height Weight  06/09/17 1316 (!) 149/80 - - (!) 124 - - - -  06/09/17 1301 (!) 141/80 - - (!) 110 - - - -  06/09/17 1246 133/84 - -  95 - - - -  06/09/17 1231 (!) 113/53 - - (!) 103 - - - -  06/09/17 1216 130/81 - - (!) 105 - - - -  06/09/17 1206 136/83 - - (!) 106 - - - -  06/09/17 1155 (!) 148/100 97.9 F (36.6 C) Oral (!) 111 18 99 %  (1.651 m) (!) 330 lb (149.7 kg)    Physical Exam  Nursing note and vitals reviewed. Constitutional: She is oriented to person, place, and time. She appears well-developed and well-nourished. No distress.  HENT:  Head: Normocephalic and atraumatic.  Eyes: Conjunctivae are normal. Right eye exhibits no discharge. Left eye exhibits no discharge. No scleral icterus.  Neck: Normal range of motion.  Cardiovascular: Normal rate, regular rhythm and normal heart sounds.   No murmur heard. Respiratory: Effort normal and breath sounds normal. No respiratory distress. She has no wheezes.  GI: Soft. Bowel sounds are normal. There is no tenderness.  Neurological: She is alert and oriented to person, place, and time. She has normal reflexes.  No clonus  Skin: Skin is warm and dry. She is not diaphoretic.  Psychiatric: She has a normal mood and affect. Her behavior is normal. Judgment and thought content normal.   Fetal Tracing:  Baseline: 145 Variability: moderate Accelerations: 15x15 Decelerations: none  Toco: none MAU Course  Procedures Results for orders placed or performed during the hospital encounter of 06/09/17 (from the past 24 hour(s))  Protein / creatinine ratio, urine     Status: Abnormal   Collection Time: 06/09/17 11:56 AM  Result Value Ref Range   Creatinine, Urine 208.00 mg/dL   Total Protein, Urine 65 mg/dL   Protein Creatinine Ratio 0.31 (H) 0.00 - 0.15 mg/mg[Cre]  Urinalysis, Routine w reflex microscopic     Status: Abnormal   Collection Time: 06/09/17 11:56 AM  Result Value Ref Range   Color, Urine YELLOW YELLOW   APPearance CLOUDY (A) CLEAR   Specific Gravity, Urine 1.026 1.005 - 1.030   pH 5.0 5.0 - 8.0   Glucose, UA NEGATIVE NEGATIVE mg/dL   Hgb urine  dipstick NEGATIVE NEGATIVE   Bilirubin Urine NEGATIVE NEGATIVE   Ketones, ur NEGATIVE NEGATIVE mg/dL   Protein, ur 161 (A) NEGATIVE mg/dL   Nitrite NEGATIVE NEGATIVE   Leukocytes, UA MODERATE (A) NEGATIVE   RBC / HPF 0-5 0 - 5 RBC/hpf   WBC, UA 6-30 0 - 5 WBC/hpf   Bacteria, UA RARE (A) NONE SEEN   Squamous Epithelial /  LPF 6-30 (A) NONE SEEN   Mucus PRESENT   CBC     Status: Abnormal   Collection Time: 06/09/17 12:10 PM  Result Value Ref Range   WBC 8.4 4.0 - 10.5 K/uL   RBC 4.06 3.87 - 5.11 MIL/uL   Hemoglobin 10.3 (L) 12.0 - 15.0 g/dL   HCT 91.4 (L) 78.2 - 95.6 %   MCV 78.6 78.0 - 100.0 fL   MCH 25.4 (L) 26.0 - 34.0 pg   MCHC 32.3 30.0 - 36.0 g/dL   RDW 21.3 08.6 - 57.8 %   Platelets 262 150 - 400 K/uL  Comprehensive metabolic panel     Status: Abnormal   Collection Time: 06/09/17 12:10 PM  Result Value Ref Range   Sodium 132 (L) 135 - 145 mmol/L   Potassium 4.0 3.5 - 5.1 mmol/L   Chloride 107 101 - 111 mmol/L   CO2 19 (L) 22 - 32 mmol/L   Glucose, Bld 88 65 - 99 mg/dL   BUN 8 6 - 20 mg/dL   Creatinine, Ser 4.69 0.44 - 1.00 mg/dL   Calcium 8.9 8.9 - 62.9 mg/dL   Total Protein 6.2 (L) 6.5 - 8.1 g/dL   Albumin 2.7 (L) 3.5 - 5.0 g/dL   AST 20 15 - 41 U/L   ALT 29 14 - 54 U/L   Alkaline Phosphatase 125 38 - 126 U/L   Total Bilirubin 0.5 0.3 - 1.2 mg/dL   GFR calc non Af Amer >60 >60 mL/min   GFR calc Af Amer >60 >60 mL/min   Anion gap 6 5 - 15    MDM Reactive NST Elevated BPs; will cycle; none severe range CBC, CMP urine PCR Elevated PCR at 0.31 C/w Dr. Renaldo Fiddler. Will schedule pt for IOL later this week. Patient stable to discharge as she has a h/a responsive to medication & no severe range BPs  Assessment and Plan  A: 1. Chronic hypertension with superimposed pre-eclampsia   2. [redacted] weeks gestation of pregnancy    P: Discharge home Discussed reasons to return to MAU, specifically s/s preeclampsia Office to call with IOL schedule If not scheduled by Thursday;  keep appt in office Increase water intake. Patient does not work.   Judeth Horn 06/09/2017, 12:16 PM

## 2017-06-10 ENCOUNTER — Encounter (HOSPITAL_COMMUNITY): Payer: Self-pay

## 2017-06-10 ENCOUNTER — Inpatient Hospital Stay (HOSPITAL_COMMUNITY): Payer: Managed Care, Other (non HMO) | Admitting: Anesthesiology

## 2017-06-10 ENCOUNTER — Inpatient Hospital Stay (HOSPITAL_COMMUNITY)
Admission: RE | Admit: 2017-06-10 | Discharge: 2017-06-13 | DRG: 787 | Disposition: A | Discharge status: Home / Self Care | Payer: Managed Care, Other (non HMO) | Source: Ambulatory Visit | Attending: Cardiovascular Disease | Admitting: Cardiovascular Disease

## 2017-06-10 ENCOUNTER — Encounter (HOSPITAL_COMMUNITY)
Admission: RE | Disposition: A | Discharge status: Home / Self Care | Payer: Self-pay | Source: Ambulatory Visit | Attending: Obstetrics and Gynecology

## 2017-06-10 VITALS — BP 141/87 | HR 88 | Temp 97.5°F | Resp 22 | Ht 65.0 in | Wt 330.0 lb

## 2017-06-10 DIAGNOSIS — E8809 Other disorders of plasma-protein metabolism, not elsewhere classified: Secondary | ICD-10-CM | POA: Diagnosis present

## 2017-06-10 DIAGNOSIS — D62 Acute posthemorrhagic anemia: Secondary | ICD-10-CM | POA: Diagnosis present

## 2017-06-10 DIAGNOSIS — O360130 Maternal care for anti-D [Rh] antibodies, third trimester, not applicable or unspecified: Secondary | ICD-10-CM | POA: Diagnosis present

## 2017-06-10 DIAGNOSIS — O99355 Diseases of the nervous system complicating the puerperium: Secondary | ICD-10-CM | POA: Diagnosis present

## 2017-06-10 DIAGNOSIS — Z883 Allergy status to other anti-infective agents status: Secondary | ICD-10-CM

## 2017-06-10 DIAGNOSIS — I471 Supraventricular tachycardia: Secondary | ICD-10-CM | POA: Diagnosis not present

## 2017-06-10 DIAGNOSIS — O1092 Unspecified pre-existing hypertension complicating childbirth: Secondary | ICD-10-CM | POA: Diagnosis present

## 2017-06-10 DIAGNOSIS — E778 Other disorders of glycoprotein metabolism: Secondary | ICD-10-CM | POA: Diagnosis present

## 2017-06-10 DIAGNOSIS — Z3A36 36 weeks gestation of pregnancy: Secondary | ICD-10-CM | POA: Diagnosis not present

## 2017-06-10 DIAGNOSIS — O903 Peripartum cardiomyopathy: Secondary | ICD-10-CM | POA: Diagnosis present

## 2017-06-10 DIAGNOSIS — O99284 Endocrine, nutritional and metabolic diseases complicating childbirth: Secondary | ICD-10-CM | POA: Diagnosis present

## 2017-06-10 DIAGNOSIS — H5710 Ocular pain, unspecified eye: Secondary | ICD-10-CM | POA: Diagnosis present

## 2017-06-10 DIAGNOSIS — Z79899 Other long term (current) drug therapy: Secondary | ICD-10-CM

## 2017-06-10 DIAGNOSIS — Z87891 Personal history of nicotine dependence: Secondary | ICD-10-CM

## 2017-06-10 DIAGNOSIS — R51 Headache: Secondary | ICD-10-CM | POA: Diagnosis present

## 2017-06-10 DIAGNOSIS — Z8759 Personal history of other complications of pregnancy, childbirth and the puerperium: Secondary | ICD-10-CM | POA: Diagnosis present

## 2017-06-10 DIAGNOSIS — Z68.41 Body mass index (BMI) pediatric, greater than or equal to 95th percentile for age: Secondary | ICD-10-CM

## 2017-06-10 DIAGNOSIS — O114 Pre-existing hypertension with pre-eclampsia, complicating childbirth: Principal | ICD-10-CM | POA: Diagnosis present

## 2017-06-10 DIAGNOSIS — O99214 Obesity complicating childbirth: Secondary | ICD-10-CM | POA: Diagnosis present

## 2017-06-10 DIAGNOSIS — O1493 Unspecified pre-eclampsia, third trimester: Secondary | ICD-10-CM | POA: Diagnosis present

## 2017-06-10 DIAGNOSIS — O1413 Severe pre-eclampsia, third trimester: Secondary | ICD-10-CM | POA: Diagnosis present

## 2017-06-10 LAB — COMPREHENSIVE METABOLIC PANEL
ALT: 30 U/L (ref 14–54)
AST: 28 U/L (ref 15–41)
Albumin: 2.6 g/dL — ABNORMAL LOW (ref 3.5–5.0)
Alkaline Phosphatase: 128 U/L — ABNORMAL HIGH (ref 38–126)
Anion gap: 9 (ref 5–15)
BUN: 7 mg/dL (ref 6–20)
CO2: 21 mmol/L — ABNORMAL LOW (ref 22–32)
Calcium: 9.4 mg/dL (ref 8.9–10.3)
Chloride: 106 mmol/L (ref 101–111)
Creatinine, Ser: 0.51 mg/dL (ref 0.44–1.00)
GFR calc Af Amer: >60 mL/min (ref >60)
GFR calc non Af Amer: >60 mL/min (ref >60)
Glucose, Bld: 93 mg/dL (ref 65–99)
Potassium: 4.4 mmol/L (ref 3.5–5.1)
Sodium: 136 mmol/L (ref 135–145)
Total Bilirubin: 0.3 mg/dL (ref 0.3–1.2)
Total Protein: 6.2 g/dL — ABNORMAL LOW (ref 6.5–8.1)

## 2017-06-10 LAB — TYPE AND SCREEN
ABO/RH(D): A NEG
Antibody Screen: NEGATIVE

## 2017-06-10 LAB — CBC
HCT: 32.1 % — ABNORMAL LOW (ref 36.0–46.0)
Hemoglobin: 10.5 g/dL — ABNORMAL LOW (ref 12.0–15.0)
MCH: 25.4 pg — ABNORMAL LOW (ref 26.0–34.0)
MCHC: 32.7 g/dL (ref 30.0–36.0)
MCV: 77.5 fL — ABNORMAL LOW (ref 78.0–100.0)
Platelets: 280 10*3/uL (ref 150–400)
RBC: 4.14 MIL/uL (ref 3.87–5.11)
RDW: 13.7 % (ref 11.5–15.5)
WBC: 10.4 10*3/uL (ref 4.0–10.5)

## 2017-06-10 LAB — RPR: RPR Ser Ql: NONREACTIVE

## 2017-06-10 SURGERY — Surgical Case
Anesthesia: Spinal

## 2017-06-10 MED ORDER — NALBUPHINE HCL 10 MG/ML IJ SOLN
5.0000 mg | Freq: Once | INTRAMUSCULAR | Status: DC | PRN
  Filled 2017-06-10: qty 0.5

## 2017-06-10 MED ORDER — DEXAMETHASONE SODIUM PHOSPHATE 4 MG/ML IJ SOLN
INTRAMUSCULAR | Status: AC
  Filled 2017-06-10: qty 2

## 2017-06-10 MED ORDER — FLEET ENEMA 7-19 GM/118ML RE ENEM: 1.0000 | ENEMA | RECTAL | Status: DC | PRN

## 2017-06-10 MED ORDER — SOD CITRATE-CITRIC ACID 500-334 MG/5ML PO SOLN
30.0000 mL | ORAL | Status: DC | PRN
  Administered 2017-06-10: 30 mL via ORAL
  Filled 2017-06-10: qty 15

## 2017-06-10 MED ORDER — EPHEDRINE 5 MG/ML INJ
INTRAVENOUS | Status: AC
  Filled 2017-06-10: qty 10

## 2017-06-10 MED ORDER — MAGNESIUM SULFATE 40 G IN LACTATED RINGERS - SIMPLE
2.0000 g/h | INTRAVENOUS | Status: DC
  Administered 2017-06-10: 2 g/h via INTRAVENOUS
  Filled 2017-06-10: qty 40

## 2017-06-10 MED ORDER — OXYTOCIN 10 UNIT/ML IJ SOLN
INTRAMUSCULAR | Status: AC
  Filled 2017-06-10: qty 4

## 2017-06-10 MED ORDER — WITCH HAZEL-GLYCERIN EX PADS: 1.0000 "application " | MEDICATED_PAD | CUTANEOUS | Status: DC | PRN

## 2017-06-10 MED ORDER — SODIUM CHLORIDE 0.9% FLUSH: 3.0000 mL | INTRAVENOUS | Status: DC | PRN

## 2017-06-10 MED ORDER — TERBUTALINE SULFATE 1 MG/ML IJ SOLN: 0.2500 mg | Freq: Once | INTRAMUSCULAR | Status: DC | PRN

## 2017-06-10 MED ORDER — NALOXONE HCL 0.4 MG/ML IJ SOLN: 0.4000 mg | INTRAMUSCULAR | Status: DC | PRN

## 2017-06-10 MED ORDER — ONDANSETRON HCL 4 MG/2ML IJ SOLN
4.0000 mg | Freq: Three times a day (TID) | INTRAMUSCULAR | Status: DC | PRN
  Administered 2017-06-10 – 2017-06-12 (×3): 4 mg via INTRAVENOUS
  Filled 2017-06-10 (×3): qty 2

## 2017-06-10 MED ORDER — LACTATED RINGERS IV SOLN: 500.0000 mL | INTRAVENOUS | Status: DC | PRN

## 2017-06-10 MED ORDER — FAMOTIDINE 20 MG PO TABS
20.0000 mg | ORAL_TABLET | Freq: Two times a day (BID) | ORAL | Status: DC
  Administered 2017-06-10 – 2017-06-13 (×7): 20 mg via ORAL
  Filled 2017-06-10 (×11): qty 1

## 2017-06-10 MED ORDER — NALOXONE HCL 2 MG/2ML IJ SOSY
1.0000 ug/kg/h | PREFILLED_SYRINGE | INTRAVENOUS | Status: DC | PRN
  Filled 2017-06-10: qty 2

## 2017-06-10 MED ORDER — LIDOCAINE HCL (PF) 1 % IJ SOLN: 30.0000 mL | INTRAMUSCULAR | Status: DC | PRN

## 2017-06-10 MED ORDER — TETANUS-DIPHTH-ACELL PERTUSSIS 5-2.5-18.5 LF-MCG/0.5 IM SUSP: 0.5000 mL | Freq: Once | INTRAMUSCULAR | Status: DC

## 2017-06-10 MED ORDER — SCOPOLAMINE 1 MG/3DAYS TD PT72
MEDICATED_PATCH | TRANSDERMAL | Status: DC | PRN
  Administered 2017-06-10: 1 via TRANSDERMAL

## 2017-06-10 MED ORDER — KETOROLAC TROMETHAMINE 30 MG/ML IJ SOLN
30.0000 mg | Freq: Four times a day (QID) | INTRAMUSCULAR | Status: AC | PRN
  Administered 2017-06-10: 30 mg via INTRAMUSCULAR

## 2017-06-10 MED ORDER — ONDANSETRON HCL 4 MG/2ML IJ SOLN
4.0000 mg | Freq: Four times a day (QID) | INTRAMUSCULAR | Status: DC | PRN
  Administered 2017-06-10: 4 mg via INTRAVENOUS
  Filled 2017-06-10: qty 2

## 2017-06-10 MED ORDER — OXYCODONE-ACETAMINOPHEN 5-325 MG PO TABS: 1.0000 | ORAL_TABLET | ORAL | Status: DC | PRN

## 2017-06-10 MED ORDER — COCONUT OIL OIL
1.0000 "application " | TOPICAL_OIL | Status: DC | PRN
  Filled 2017-06-10: qty 120

## 2017-06-10 MED ORDER — LACTATED RINGERS IV SOLN
INTRAVENOUS | Status: DC | PRN
  Administered 2017-06-10: 12:00:00 via INTRAVENOUS

## 2017-06-10 MED ORDER — LABETALOL HCL 100 MG PO TABS
100.0000 mg | ORAL_TABLET | Freq: Two times a day (BID) | ORAL | Status: DC
  Administered 2017-06-10 – 2017-06-11 (×4): 100 mg via ORAL
  Filled 2017-06-10 (×4): qty 1

## 2017-06-10 MED ORDER — SCOPOLAMINE 1 MG/3DAYS TD PT72
1.0000 | MEDICATED_PATCH | Freq: Once | TRANSDERMAL | Status: DC
  Filled 2017-06-10: qty 1

## 2017-06-10 MED ORDER — ACETAMINOPHEN 500 MG PO TABS
1000.0000 mg | ORAL_TABLET | Freq: Four times a day (QID) | ORAL | Status: AC
  Administered 2017-06-10 – 2017-06-11 (×4): 1000 mg via ORAL
  Filled 2017-06-10 (×4): qty 2

## 2017-06-10 MED ORDER — PENICILLIN G POT IN DEXTROSE 60000 UNIT/ML IV SOLN
3.0000 10*6.[IU] | INTRAVENOUS | Status: DC
  Filled 2017-06-10 (×3): qty 50

## 2017-06-10 MED ORDER — ZOLPIDEM TARTRATE 5 MG PO TABS
5.0000 mg | ORAL_TABLET | Freq: Every evening | ORAL | Status: DC | PRN
  Administered 2017-06-10: 5 mg via ORAL
  Filled 2017-06-10: qty 1

## 2017-06-10 MED ORDER — DIBUCAINE 1 % RE OINT: 1.0000 "application " | TOPICAL_OINTMENT | RECTAL | Status: DC | PRN

## 2017-06-10 MED ORDER — MAGNESIUM SULFATE BOLUS VIA INFUSION
4.0000 g | Freq: Once | INTRAVENOUS | Status: AC
  Administered 2017-06-10: 4 g via INTRAVENOUS
  Filled 2017-06-10: qty 500

## 2017-06-10 MED ORDER — OXYCODONE-ACETAMINOPHEN 5-325 MG PO TABS: 2.0000 | ORAL_TABLET | ORAL | Status: DC | PRN

## 2017-06-10 MED ORDER — MORPHINE SULFATE (PF) 0.5 MG/ML IJ SOLN
INTRAMUSCULAR | Status: AC
  Filled 2017-06-10: qty 10

## 2017-06-10 MED ORDER — FENTANYL CITRATE (PF) 100 MCG/2ML IJ SOLN
INTRAMUSCULAR | Status: AC
  Filled 2017-06-10: qty 2

## 2017-06-10 MED ORDER — OXYCODONE HCL 5 MG PO TABS
5.0000 mg | ORAL_TABLET | ORAL | Status: DC | PRN
Start: 2017-06-10 — End: 2017-06-13
  Administered 2017-06-11 – 2017-06-13 (×3): 5 mg via ORAL
  Filled 2017-06-10 (×3): qty 1

## 2017-06-10 MED ORDER — DEXAMETHASONE SODIUM PHOSPHATE 4 MG/ML IJ SOLN
INTRAMUSCULAR | Status: DC | PRN
  Administered 2017-06-10: 8 mg via INTRAVENOUS

## 2017-06-10 MED ORDER — IBUPROFEN 200 MG PO TABS
600.0000 mg | ORAL_TABLET | Freq: Four times a day (QID) | ORAL | Status: DC
  Administered 2017-06-10 – 2017-06-13 (×11): 600 mg via ORAL
  Filled 2017-06-10: qty 1
  Filled 2017-06-10: qty 3
  Filled 2017-06-10 (×6): qty 1
  Filled 2017-06-10: qty 3
  Filled 2017-06-10 (×2): qty 1
  Filled 2017-06-10: qty 3

## 2017-06-10 MED ORDER — SCOPOLAMINE 1 MG/3DAYS TD PT72
MEDICATED_PATCH | TRANSDERMAL | Status: AC
  Filled 2017-06-10: qty 1

## 2017-06-10 MED ORDER — FENTANYL CITRATE (PF) 100 MCG/2ML IJ SOLN
INTRAMUSCULAR | Status: DC | PRN
  Administered 2017-06-10: 10 ug via INTRATHECAL

## 2017-06-10 MED ORDER — SIMETHICONE 80 MG PO CHEW: 80.0000 mg | CHEWABLE_TABLET | ORAL | Status: DC | PRN

## 2017-06-10 MED ORDER — PRENATAL MULTIVITAMIN CH
1.0000 | ORAL_TABLET | Freq: Every day | ORAL | Status: DC
  Administered 2017-06-11 – 2017-06-13 (×3): 1 via ORAL
  Filled 2017-06-10 (×3): qty 1

## 2017-06-10 MED ORDER — LACTATED RINGERS IV SOLN
INTRAVENOUS | Status: DC
  Administered 2017-06-11: 02:00:00 via INTRAVENOUS

## 2017-06-10 MED ORDER — NALBUPHINE HCL 10 MG/ML IJ SOLN
5.0000 mg | INTRAMUSCULAR | Status: DC | PRN
  Administered 2017-06-11: 5 mg via SUBCUTANEOUS
  Filled 2017-06-10: qty 0.5
  Filled 2017-06-10: qty 1

## 2017-06-10 MED ORDER — OXYTOCIN 40 UNITS IN LACTATED RINGERS INFUSION - SIMPLE MED: 2.5000 [IU]/h | INTRAVENOUS | Status: AC

## 2017-06-10 MED ORDER — OXYTOCIN BOLUS FROM INFUSION: 500.0000 mL | Freq: Once | INTRAVENOUS | Status: DC

## 2017-06-10 MED ORDER — DIPHENHYDRAMINE HCL 25 MG PO CAPS: 25.0000 mg | ORAL_CAPSULE | ORAL | Status: DC | PRN

## 2017-06-10 MED ORDER — BUTORPHANOL TARTRATE 1 MG/ML IJ SOLN: 1.0000 mg | INTRAMUSCULAR | Status: DC | PRN

## 2017-06-10 MED ORDER — OXYTOCIN 40 UNITS IN LACTATED RINGERS INFUSION - SIMPLE MED: 2.5000 [IU]/h | INTRAVENOUS | Status: DC

## 2017-06-10 MED ORDER — MENTHOL 3 MG MT LOZG: 1.0000 | LOZENGE | OROMUCOSAL | Status: DC | PRN

## 2017-06-10 MED ORDER — SIMETHICONE 80 MG PO CHEW
80.0000 mg | CHEWABLE_TABLET | ORAL | Status: DC
  Administered 2017-06-10 – 2017-06-12 (×3): 80 mg via ORAL
  Filled 2017-06-10 (×3): qty 1

## 2017-06-10 MED ORDER — DEXTROSE 5 % IV SOLN
INTRAVENOUS | Status: DC | PRN
  Administered 2017-06-10: 3 g via INTRAVENOUS

## 2017-06-10 MED ORDER — KETOROLAC TROMETHAMINE 30 MG/ML IJ SOLN
INTRAMUSCULAR | Status: AC
  Filled 2017-06-10: qty 1

## 2017-06-10 MED ORDER — MEPERIDINE HCL 25 MG/ML IJ SOLN: 6.2500 mg | INTRAMUSCULAR | Status: DC | PRN

## 2017-06-10 MED ORDER — FENTANYL CITRATE (PF) 100 MCG/2ML IJ SOLN
25.0000 ug | INTRAMUSCULAR | Status: DC | PRN
  Administered 2017-06-10 (×2): 50 ug via INTRAVENOUS

## 2017-06-10 MED ORDER — DIPHENHYDRAMINE HCL 50 MG/ML IJ SOLN: 12.5000 mg | INTRAMUSCULAR | Status: DC | PRN

## 2017-06-10 MED ORDER — LACTATED RINGERS IV SOLN
INTRAVENOUS | Status: DC
  Administered 2017-06-10 (×2): via INTRAVENOUS

## 2017-06-10 MED ORDER — DEXTROSE 5 % IV SOLN
3.0000 g | INTRAVENOUS | Status: DC
  Filled 2017-06-10: qty 3000

## 2017-06-10 MED ORDER — PHENYLEPHRINE HCL 10 MG/ML IJ SOLN
INTRAMUSCULAR | Status: DC | PRN
  Administered 2017-06-10: 80 ug via INTRAVENOUS

## 2017-06-10 MED ORDER — NALBUPHINE HCL 10 MG/ML IJ SOLN
5.0000 mg | INTRAMUSCULAR | Status: DC | PRN
  Filled 2017-06-10: qty 0.5

## 2017-06-10 MED ORDER — ONDANSETRON HCL 4 MG/2ML IJ SOLN
INTRAMUSCULAR | Status: DC | PRN
  Administered 2017-06-10: 4 mg via INTRAVENOUS

## 2017-06-10 MED ORDER — ZOLPIDEM TARTRATE 5 MG PO TABS
5.0000 mg | ORAL_TABLET | Freq: Every evening | ORAL | Status: DC | PRN
Start: 2017-06-10 — End: 2017-06-13

## 2017-06-10 MED ORDER — PHENYLEPHRINE 8 MG IN D5W 100 ML (0.08MG/ML) PREMIX OPTIME
INJECTION | INTRAVENOUS | Status: DC | PRN
  Administered 2017-06-10: 40 ug/min via INTRAVENOUS

## 2017-06-10 MED ORDER — BUPIVACAINE IN DEXTROSE 0.75-8.25 % IT SOLN
INTRATHECAL | Status: DC | PRN
  Administered 2017-06-10: 1.4 mL via INTRATHECAL

## 2017-06-10 MED ORDER — METOCLOPRAMIDE HCL 5 MG/ML IJ SOLN
10.0000 mg | Freq: Once | INTRAMUSCULAR | Status: AC | PRN
  Administered 2017-06-10: 10 mg via INTRAVENOUS
  Filled 2017-06-10: qty 2

## 2017-06-10 MED ORDER — SIMETHICONE 80 MG PO CHEW
80.0000 mg | CHEWABLE_TABLET | Freq: Three times a day (TID) | ORAL | Status: DC
  Administered 2017-06-10 – 2017-06-12 (×4): 80 mg via ORAL
  Filled 2017-06-10 (×5): qty 1

## 2017-06-10 MED ORDER — MAGNESIUM SULFATE 40 G IN LACTATED RINGERS - SIMPLE
2.0000 g/h | INTRAVENOUS | Status: DC
  Administered 2017-06-10: 2 g/h via INTRAVENOUS
  Filled 2017-06-10: qty 500
  Filled 2017-06-10: qty 40

## 2017-06-10 MED ORDER — OXYCODONE HCL 5 MG PO TABS
10.0000 mg | ORAL_TABLET | ORAL | Status: DC | PRN
  Administered 2017-06-11 – 2017-06-12 (×4): 10 mg via ORAL
  Filled 2017-06-10 (×4): qty 2

## 2017-06-10 MED ORDER — PENICILLIN G POTASSIUM 5000000 UNITS IJ SOLR
5.0000 10*6.[IU] | Freq: Once | INTRAVENOUS | Status: AC
  Administered 2017-06-10: 5 10*6.[IU] via INTRAVENOUS
  Filled 2017-06-10: qty 5

## 2017-06-10 MED ORDER — DIPHENHYDRAMINE HCL 25 MG PO CAPS: 25.0000 mg | ORAL_CAPSULE | Freq: Four times a day (QID) | ORAL | Status: DC | PRN

## 2017-06-10 MED ORDER — BUPIVACAINE IN DEXTROSE 0.75-8.25 % IT SOLN
INTRATHECAL | Status: AC
  Filled 2017-06-10: qty 2

## 2017-06-10 MED ORDER — SENNOSIDES-DOCUSATE SODIUM 8.6-50 MG PO TABS
2.0000 | ORAL_TABLET | ORAL | Status: DC
  Administered 2017-06-10 – 2017-06-12 (×3): 2 via ORAL
  Filled 2017-06-10 (×3): qty 2

## 2017-06-10 MED ORDER — ACETAMINOPHEN 325 MG PO TABS: 650.0000 mg | ORAL_TABLET | ORAL | Status: DC | PRN

## 2017-06-10 MED ORDER — KETOROLAC TROMETHAMINE 30 MG/ML IJ SOLN: 30.0000 mg | Freq: Four times a day (QID) | INTRAMUSCULAR | Status: AC | PRN

## 2017-06-10 MED ORDER — EPHEDRINE SULFATE 50 MG/ML IJ SOLN
INTRAMUSCULAR | Status: DC | PRN
  Administered 2017-06-10 (×2): 5 mg via INTRAVENOUS

## 2017-06-10 MED ORDER — ACETAMINOPHEN 325 MG PO TABS
650.0000 mg | ORAL_TABLET | ORAL | Status: DC | PRN
  Administered 2017-06-12 – 2017-06-13 (×2): 650 mg via ORAL
  Filled 2017-06-10 (×2): qty 2

## 2017-06-10 MED ORDER — MORPHINE SULFATE (PF) 0.5 MG/ML IJ SOLN
INTRAMUSCULAR | Status: DC | PRN
  Administered 2017-06-10: .2 mg via INTRATHECAL

## 2017-06-10 MED ORDER — MISOPROSTOL 25 MCG QUARTER TABLET
25.0000 ug | ORAL_TABLET | ORAL | Status: DC | PRN
  Administered 2017-06-10 (×2): 25 ug via VAGINAL
  Filled 2017-06-10 (×2): qty 1

## 2017-06-10 MED ORDER — ONDANSETRON HCL 4 MG/2ML IJ SOLN
INTRAMUSCULAR | Status: AC
  Filled 2017-06-10: qty 2

## 2017-06-10 MED ORDER — LACTATED RINGERS IV SOLN: INTRAVENOUS | Status: DC

## 2017-06-10 MED ORDER — OXYTOCIN 10 UNIT/ML IJ SOLN
INTRAVENOUS | Status: DC | PRN
  Administered 2017-06-10: 40 [IU] via INTRAVENOUS

## 2017-06-10 MED ORDER — CEFAZOLIN SODIUM 10 G IJ SOLR
INTRAMUSCULAR | Status: AC
  Filled 2017-06-10: qty 3000

## 2017-06-10 MED ORDER — PHENYLEPHRINE 8 MG IN D5W 100 ML (0.08MG/ML) PREMIX OPTIME
INJECTION | INTRAVENOUS | Status: AC
  Filled 2017-06-10: qty 100

## 2017-06-10 SURGICAL SUPPLY — 38 items
ADH SKN CLS APL DERMABOND .7 (GAUZE/BANDAGES/DRESSINGS)
APL SKNCLS STERI-STRIP NONHPOA (GAUZE/BANDAGES/DRESSINGS)
BENZOIN TINCTURE PRP APPL 2/3 (GAUZE/BANDAGES/DRESSINGS) IMPLANT
CHLORAPREP W/TINT 26ML (MISCELLANEOUS) ×3 IMPLANT
CLAMP CORD UMBIL (MISCELLANEOUS) IMPLANT
CLOSURE WOUND 1/2 X4 (GAUZE/BANDAGES/DRESSINGS)
CLOTH BEACON ORANGE TIMEOUT ST (SAFETY) ×3 IMPLANT
CONTAINER PREFILL 10% NBF 15ML (MISCELLANEOUS) IMPLANT
DERMABOND ADVANCED (GAUZE/BANDAGES/DRESSINGS)
DERMABOND ADVANCED .7 DNX12 (GAUZE/BANDAGES/DRESSINGS) IMPLANT
DRSG OPSITE POSTOP 4X10 (GAUZE/BANDAGES/DRESSINGS) ×3 IMPLANT
ELECT REM PT RETURN 9FT ADLT (ELECTROSURGICAL) ×3
ELECTRODE REM PT RTRN 9FT ADLT (ELECTROSURGICAL) ×1 IMPLANT
EXTRACTOR VACUUM M CUP 4 TUBE (SUCTIONS) ×1 IMPLANT
EXTRACTOR VACUUM M CUP 4' TUBE (SUCTIONS) ×1
GLOVE BIO SURGEON STRL SZ7.5 (GLOVE) ×3 IMPLANT
GLOVE BIOGEL PI IND STRL 7.0 (GLOVE) ×1 IMPLANT
GLOVE BIOGEL PI INDICATOR 7.0 (GLOVE) ×2
GOWN STRL REUS W/TWL LRG LVL3 (GOWN DISPOSABLE) ×6 IMPLANT
KIT ABG SYR 3ML LUER SLIP (SYRINGE) ×5 IMPLANT
NDL HYPO 25X5/8 SAFETYGLIDE (NEEDLE) ×1 IMPLANT
NEEDLE HYPO 25X5/8 SAFETYGLIDE (NEEDLE) ×6 IMPLANT
NS IRRIG 1000ML POUR BTL (IV SOLUTION) ×3 IMPLANT
PACK C SECTION WH (CUSTOM PROCEDURE TRAY) ×3 IMPLANT
PAD OB MATERNITY 4.3X12.25 (PERSONAL CARE ITEMS) ×3 IMPLANT
PENCIL SMOKE EVAC W/HOLSTER (ELECTROSURGICAL) ×3 IMPLANT
STRIP CLOSURE SKIN 1/2X4 (GAUZE/BANDAGES/DRESSINGS) IMPLANT
SUT CHROMIC 2 0 SH (SUTURE) ×2 IMPLANT
SUT MNCRL 0 VIOLET CTX 36 (SUTURE) ×4 IMPLANT
SUT MONOCRYL 0 CTX 36 (SUTURE) ×8
SUT PDS AB 0 CTX 60 (SUTURE) ×3 IMPLANT
SUT PLAIN 0 NONE (SUTURE) IMPLANT
SUT PLAIN 2 0 (SUTURE)
SUT PLAIN 2 0 XLH (SUTURE) IMPLANT
SUT PLAIN ABS 2-0 CT1 27XMFL (SUTURE) IMPLANT
SUT VIC AB 4-0 KS 27 (SUTURE) ×5 IMPLANT
TOWEL OR 17X24 6PK STRL BLUE (TOWEL DISPOSABLE) ×3 IMPLANT
TRAY FOLEY BAG SILVER LF 14FR (SET/KITS/TRAYS/PACK) ×3 IMPLANT

## 2017-06-10 NOTE — Brief Op Note (Signed)
06/10/2017  12:57 PM  PATIENT:  Melinda Riddle  21 y.o. female  PRE-OPERATIVE DIAGNOSIS:  patient request,  pre-eclampsia  POST-OPERATIVE DIAGNOSIS:  patient request, pre-eclampsia  PROCEDURE:  Procedure(s): CESAREAN SECTION (N/A)  SURGEON:  Surgeon(s) and Role:    Harold Hedge, MD - Primary  PHYSICIAN ASSISTANT:   ASSISTANTS: none   ANESTHESIA:   epidural  EBL:  Total I/O In: 2125 [I.V.:1875; IV Piggyback:250] Out: 1403 [Urine:700; Blood:703]  BLOOD ADMINISTERED:none  DRAINS: Urinary Catheter (Foley)   LOCAL MEDICATIONS USED:  NONE  SPECIMEN:  Source of Specimen:  placenta  DISPOSITION OF SPECIMEN:  PATHOLOGY  COUNTS:  YES  TOURNIQUET:  * No tourniquets in log *  DICTATION: .Other Dictation: Dictation Number [id number]  PLAN OF CARE: Admit to inpatient   PATIENT DISPOSITION:  PACU - hemodynamically stable.   Delay start of Pharmacological VTE agent (>24hrs) due to surgical blood loss or risk of bleeding: not applicable

## 2017-06-10 NOTE — Transfer of Care (Signed)
Immediate Anesthesia Transfer of Care Note  Patient: Melinda Riddle  Procedure(s) Performed: CESAREAN SECTION (N/A )  Patient Location: PACU  Anesthesia Type:Spinal  Level of Consciousness: awake and patient cooperative  Airway & Oxygen Therapy: Patient Spontanous Breathing  Post-op Assessment: Report given to RN and Post -op Vital signs reviewed and stable  Post vital signs: Reviewed and stable  Last Vitals:  Vitals:   06/10/17 1127 06/10/17 1129  BP:  (!) 151/89  Pulse:  (!) 116  Resp: 16   Temp:    SpO2:      Last Pain:  Vitals:   06/10/17 0802  TempSrc: Oral  PainSc:          Complications: No apparent anesthesia complications

## 2017-06-10 NOTE — Progress Notes (Signed)
Pt c/o 2 episodes of palpitations and tachycardia (HR 180s per RN) overnight.  One episode lasting 4 min and another approx Rec EKG during next episode of tachycardia.  Pt understands to call her nurse with next episode and will do EKG (leads attached).  Anesthesia aware

## 2017-06-10 NOTE — Anesthesia Procedure Notes (Signed)
Spinal  Patient location during procedure: OR Staffing Anesthesiologist: Wilhelmina Hark Performed: anesthesiologist  Preanesthetic Checklist Completed: patient identified, site marked, surgical consent, pre-op evaluation, timeout performed, IV checked, risks and benefits discussed and monitors and equipment checked Spinal Block Patient position: sitting Prep: DuraPrep Patient monitoring: heart rate, continuous pulse ox and blood pressure Approach: midline Location: L4-5 Injection technique: single-shot Needle Needle type: Sprotte  Needle gauge: 24 G Needle length: 9 cm Additional Notes Expiration date of kit checked and confirmed. Patient tolerated procedure well, without complications.       

## 2017-06-10 NOTE — Progress Notes (Signed)
No recent episodes of tachycardia Cx 1/50/-3 @ about 6am FHT cat one UCs q3-5 min  BP 140-150s/82-106 P 90-100s  Patient requests cesarean section Carefully D/W patient and family 2 stage induction vs C/S C/S carefully discussed including risks-infection, organ damage, bleeding/transfusion-HIV/Hep, DVT/PE, pneumonia, wound breakdown.  A/P: Preeclampsia         Pseudo tumor cerebrii         Possible intermittent SVT         Cesarean section

## 2017-06-10 NOTE — Anesthesia Preprocedure Evaluation (Addendum)
Anesthesia Evaluation  Patient identified by MRN, date of birth, ID band Patient awake    Reviewed: Allergy & Precautions, NPO status , Patient's Chart, lab work & pertinent test results  History of Anesthesia Complications (+) PONV  Airway Mallampati: II  TM Distance: >3 FB Neck ROM: Full    Dental no notable dental hx.    Pulmonary former smoker,    Pulmonary exam normal breath sounds clear to auscultation       Cardiovascular hypertension, Normal cardiovascular exam Rhythm:Regular Rate:Normal     Neuro/Psych Pseudotumor cerebri negative neurological ROS  negative psych ROS   GI/Hepatic negative GI ROS, Neg liver ROS,   Endo/Other  Morbid obesity  Renal/GU negative Renal ROS  negative genitourinary   Musculoskeletal negative musculoskeletal ROS (+)   Abdominal   Peds negative pediatric ROS (+)  Hematology negative hematology ROS (+)   Anesthesia Other Findings   Reproductive/Obstetrics (+) Pregnancy                            Anesthesia Physical Anesthesia Plan  ASA: III  Anesthesia Plan: Spinal   Post-op Pain Management:    Induction:   PONV Risk Score and Plan: 3 and Ondansetron and Treatment may vary due to age or medical condition  Airway Management Planned: Natural Airway  Additional Equipment:   Intra-op Plan:   Post-operative Plan:   Informed Consent: I have reviewed the patients History and Physical, chart, labs and discussed the procedure including the risks, benefits and alternatives for the proposed anesthesia with the patient or authorized representative who has indicated his/her understanding and acceptance.   Dental advisory given  Plan Discussed with:   Anesthesia Plan Comments:         Anesthesia Quick Evaluation

## 2017-06-11 LAB — CBC
HCT: 29.4 % — ABNORMAL LOW (ref 36.0–46.0)
Hemoglobin: 9.5 g/dL — ABNORMAL LOW (ref 12.0–15.0)
MCH: 25 pg — ABNORMAL LOW (ref 26.0–34.0)
MCHC: 32.3 g/dL (ref 30.0–36.0)
MCV: 77.4 fL — ABNORMAL LOW (ref 78.0–100.0)
Platelets: 253 10*3/uL (ref 150–400)
RBC: 3.8 MIL/uL — ABNORMAL LOW (ref 3.87–5.11)
RDW: 13.5 % (ref 11.5–15.5)
WBC: 10.3 10*3/uL (ref 4.0–10.5)

## 2017-06-11 LAB — COMPREHENSIVE METABOLIC PANEL
ALT: 22 U/L (ref 14–54)
AST: 19 U/L (ref 15–41)
Albumin: 2.3 g/dL — ABNORMAL LOW (ref 3.5–5.0)
Alkaline Phosphatase: 104 U/L (ref 38–126)
Anion gap: 8 (ref 5–15)
BUN: 7 mg/dL (ref 6–20)
CO2: 21 mmol/L — ABNORMAL LOW (ref 22–32)
Calcium: 7.7 mg/dL — ABNORMAL LOW (ref 8.9–10.3)
Chloride: 104 mmol/L (ref 101–111)
Creatinine, Ser: 0.55 mg/dL (ref 0.44–1.00)
GFR calc Af Amer: >60 mL/min (ref >60)
GFR calc non Af Amer: >60 mL/min (ref >60)
Glucose, Bld: 89 mg/dL (ref 65–99)
Potassium: 3.9 mmol/L (ref 3.5–5.1)
Sodium: 133 mmol/L — ABNORMAL LOW (ref 135–145)
Total Bilirubin: 0.5 mg/dL (ref 0.3–1.2)
Total Protein: 5.7 g/dL — ABNORMAL LOW (ref 6.5–8.1)

## 2017-06-11 MED ORDER — INFLUENZA VAC SPLIT QUAD 0.5 ML IM SUSY: 0.5000 mL | PREFILLED_SYRINGE | INTRAMUSCULAR | Status: DC

## 2017-06-11 MED ORDER — RHO D IMMUNE GLOBULIN 1500 UNIT/2ML IJ SOSY
300.0000 ug | PREFILLED_SYRINGE | Freq: Once | INTRAMUSCULAR | Status: AC
  Administered 2017-06-11: 300 ug via INTRAVENOUS
  Filled 2017-06-11: qty 2

## 2017-06-11 MED ORDER — METOPROLOL TARTRATE 5 MG/5ML IV SOLN
5.0000 mg | Freq: Four times a day (QID) | INTRAVENOUS | Status: DC
Start: 2017-06-12 — End: 2017-06-12
  Administered 2017-06-11 – 2017-06-12 (×3): 5 mg via INTRAVENOUS
  Filled 2017-06-11 (×7): qty 5

## 2017-06-11 MED ORDER — METOPROLOL TARTRATE 5 MG/5ML IV SOLN
5.0000 mg | Freq: Four times a day (QID) | INTRAVENOUS | Status: DC
  Filled 2017-06-11 (×4): qty 5

## 2017-06-11 NOTE — Lactation Note (Signed)
This note was copied from a baby's chart. Lactation Consultation Note  Patient Name: Girl Anikka Marsan WGNFA'O Date: 06/11/2017 Reason for consult: Follow-up assessment;Early term 66-38.6wks  Baby is 79 hours  old the start of the [facility] consult.  Baby was spitty to start, clear to yellow tinged. Used bulb syringe.  LC assisted mom to latch after review of breast massage, hand express,  And reverse pressure. Latched with depth , and fed for 15 mins,  Increased with breast compressions.  LC updated doc flow sheets per dad.  LC recommended due to the baby being an early term infant - to breast feed 1st breast  At least 10 mins , 30 mins max and supplement with EBM if available, supplementing with formula  Can be evaluated due to the baby latching well.  Post pump after feedings for 15 - 20 mins , save milk for next feeding.  LC mentioned to mom as the volume increases baby can be supplemented with artifical nipple to conserve energy.  Mother informed of post-discharge support and given phone number to the lactation department, including services for phone call assistance; out-patient appointments; and breastfeeding support group. List of other breastfeeding resources in the community given in the handout. Encouraged mother to call for problems or concerns related to breastfeeding. Mom , dad , and grandmother receptive to [facility] plan.    Maternal Data Has patient been taught Hand Expression?: Yes Does the patient have breastfeeding experience prior to this delivery?: No  Feeding Feeding Type: Breast Milk Length of feed: 15 min (increased swallows )  LATCH Score Latch: Grasps breast easily, tongue down, lips flanged, rhythmical sucking.  Audible Swallowing: Spontaneous and intermittent  Type of Nipple: Everted at rest and after stimulation  Comfort (Breast/Nipple): Soft / non-tender  Hold (Positioning): Assistance needed to correctly position infant at breast and maintain  latch.  LATCH Score: 9  Interventions Interventions: Breast feeding basics reviewed  Lactation Tools Discussed/Used Tools:  (enc to post pump ) Flange Size: 24 Breast pump type: Double-Electric Breast Pump WIC Program: Yes Pump Review: Setup, frequency, and cleaning Initiated by:: by RN    Consult Status Consult Status: Follow-up Date: 06/12/17 Follow-up type: In-patient    Matilde Sprang [place] 06/11/2017, 2:00 PM

## 2017-06-11 NOTE — Op Note (Signed)
NAME:  Melinda Riddle, Melinda Riddle                ACCOUNT NO.:  MEDICAL RECORD NO.:  [id number]  LOCATION:                                 FACILITY:  PHYSICIAN:  Guy Sandifer. Henderson Cloud, M.D. DATE OF BIRTH:  1995-11-10  DATE OF PROCEDURE:  06/10/2017 DATE OF DISCHARGE:                              OPERATIVE REPORT   PREOPERATIVE DIAGNOSES: 1. Preeclampsia. 2. The patient requested for cesarean section.  POSTOPERATIVE DIAGNOSES: 1. Preeclampsia. 2. The patient requested for cesarean section.  PROCEDURE:  Primary low transverse cesarean section.  SURGEON:  Guy Sandifer. Henderson Cloud, M.D.  ANESTHESIA:  Epidural.  ESTIMATED BLOOD LOSS:  700 mL.  SPECIMENS:  Placenta to Pathology.  FINDINGS:  Viable female infant.  Apgars, arterial cord pH, and birth weight pending.  INDICATIONS AND CONSENT:  This patient is a 21 year old, G1, P0, at 37 weeks with preeclampsia with severe features.  She was admitted in the early a.m., where she undergoes Cytotec and then was transitioned to Pitocin.  She is on magnesium sulfate for seizure prophylaxis.  Labs have been normal.  The patient requests cesarean section.  Careful discussion of trial of labor versus cesarean section was undertaken. Risks and complications were reviewed preoperatively including, but not limited to, infection, organ damage, bleeding requiring transfusion of blood products with HIV and hepatitis acquisition, DVT, PE, pneumonia, and wound breakdown.  The patient states she understands and agrees. All questions were answered.  Consent was signed on the chart.  DESCRIPTION OF PROCEDURE:  The patient was taken to the operating room, where she was identified and her epidural anesthetic was augmented to a surgical level.  She was placed in a dorsal supine position with a 15- degree left lateral wedge.  The traction device was used to elevate the panniculus.  She was prepped vaginally.  Foley catheter was in place and she was prepped abdominally  both with Betadine.  She was draped in a sterile fashion.  Time-out was undertaken.  After testing for adequate epidural anesthesia, skin was entered through a Pfannenstiel incision and dissection was carried out in layers to the peritoneum.  Peritoneum was incised and extended superiorly and inferiorly.  Vesicouterine peritoneum was taken down cephalad laterally.  The bladder flap developed.  The bladder blade was placed.  Uterus was incised in a low transverse manner and the uterine cavity was entered bluntly with a hemostat.  Clear fluid was noted.  The uterine incision was extended laterally with the fingers.  Vacuum was used to elevate the vertex through the incision with no pop-offs.  The baby was then delivered without difficulty.  After 1 minute, cord was clamped and cut, and the baby was handed to awaiting Pediatrics team.  Placenta was manually delivered and sent to Pathology.  Uterine cavity was clean.  Uterus was closed in 2 running locking imbricating layers of 0 Monocryl suture.  A figure-of-eight of 2-0 chromic was used to obtain complete hemostasis. Lavage was carried out.  Anterior peritoneum was closed in a running fashion with 0 Monocryl suture which was also used to reapproximate the pyramidalis muscle in the midline.  Anterior rectus fascia was closed in a running fashion with a 0 looped  PDS suture.  Subcutaneous layer was closed with interrupted plain and the skin was closed in a subcuticular fashion with 4-0 Vicryl on a Keith needle.  A PICO wound VAC was then placed.  All counts were correct.  The patient was taken to the recovery room in stable condition.     Guy Sandifer Henderson Cloud, M.D.     JET/MEDQ  D:  06/10/2017  T:  06/11/2017  Job:  [id number]

## 2017-06-11 NOTE — Anesthesia Postprocedure Evaluation (Signed)
Anesthesia Post Note  Patient: Melinda Riddle  Procedure(s) Performed: CESAREAN SECTION (N/A )     Patient location during evaluation: PACU Anesthesia Type: Spinal Level of consciousness: awake and alert Pain management: pain level controlled Vital Signs Assessment: post-procedure vital signs reviewed and stable Respiratory status: spontaneous breathing and respiratory function stable Cardiovascular status: blood pressure returned to baseline and stable Postop Assessment: no headache, no backache and spinal receding Anesthetic complications: no    Last Vitals:  Vitals:   06/11/17 1204 06/11/17 1600  BP: 137/63 (!) 144/67  Pulse: 76 76  Resp: (!) 24 (!) 24  Temp: (!) 36.3 C 36.4 C  SpO2: 100% 100%    Last Pain:  Vitals:   06/11/17 1600  TempSrc: Oral  PainSc:    Pain Goal: Patients Stated Pain Goal: 3 (06/10/17 1530)               Phillips Grout

## 2017-06-11 NOTE — Progress Notes (Signed)
Patient c/o of feeling her heart beating faster.  This RN auscultated a regular heart rate of 80.  Dr. Rana Snare notified, no orders received.  Will continue to monitor.

## 2017-06-11 NOTE — Progress Notes (Signed)
Subjective: Postpartum Day 1: Cesarean Delivery Patient reports tolerating PO, + flatus and + BM.    Objective: Vital signs in last 24 hours: Temp:  [97.4 F (36.3 C)-98.1 F (36.7 C)] 97.6 F (36.4 C) (10/03 0852) Pulse Rate:  [64-116] 71 (10/03 0852) Resp:  [12-32] 28 (10/03 0956) BP: (103-152)/(55-89) 124/64 (10/03 0852) SpO2:  [98 %-100 %] 100 % (10/03 1610)  Physical Exam:  General: alert, cooperative, appears stated age and no distress Lochia: appropriate Uterine Fundus: firm Incision: healing well DVT Evaluation: No evidence of DVT seen on physical exam.   Recent Labs  06/10/17 0059 06/11/17 0602  HGB 10.5* 9.5*  HCT 32.1* 29.4*    Assessment/Plan: Status post Cesarean section. Doing well postoperatively.  Preeclampsia- asymptomatic with normal labs and BPs (on labetalol) D/C mag.  Marlowe Lawes C 06/11/2017, 10:07 AM

## 2017-06-11 NOTE — Consult Note (Signed)
Referring Physician:  [name] is an 21 y.o. female.                       Chief Complaint: Palpitation  HPI: 21 year old female, post partum day 1 and h/o of preeclampsia has frequent episodes of palpitations. She has no chest pain or dizziness but has butterfly fluttering in chest.   Past Medical History:  Diagnosis Date  . Family history of adverse reaction to anesthesia    grandmother is hard to wake up  . History of bronchitis as a child   . History of kidney stones 10/2015  . Hypertension    "since preg has been higher"  . Migraines    takes Propranolol daily and Zanaflex as needed  . PONV (postoperative nausea and vomiting)    hard to wake up  . Pseudotumor cerebri       Past Surgical History:  Procedure Laterality Date  . CESAREAN SECTION N/A 06/10/2017   Procedure: CESAREAN SECTION;  Surgeon: Everlene Farrier, MD;  Location: [facility];  Service: Obstetrics;  Laterality: N/A;  . CHOLECYSTECTOMY N/A 12/20/2015   Procedure: LAPAROSCOPIC CHOLECYSTECTOMY;  Surgeon: Coralie Keens, MD;  Location: [place];  Service: General;  Laterality: N/A;  . TONSILLECTOMY AND ADENOIDECTOMY     63 or 21 years old     Family History  Problem Relation Age of Onset  . Lung cancer Maternal Grandfather        Died at 66  . Cancer Maternal Grandfather   . Hypertension Mother   . Diabetes Sister   . Heart disease Maternal Grandmother   . Diabetes Maternal Aunt   . Diabetes Maternal Uncle   . Cancer Maternal Uncle    Social History:  reports that she has quit smoking. Her smoking use included Cigarettes. She has never used smokeless tobacco. She reports that she does not drink alcohol or use drugs.  Allergies:  Allergies  Allergen Reactions  . Chlorhexidine Itching, Rash and Other (See Comments)    Reaction:  Burning     Medications Prior to Admission  Medication Sig Dispense Refill  . acetaminophen (TYLENOL) 500 MG tablet Take 1,000 mg by mouth every 6 (six) hours as  needed for mild pain.    . cyclobenzaprine (FLEXERIL) 10 MG tablet Take 1 tablet (10 mg total) by mouth 2 (two) times daily as needed for muscle spasms. 20 tablet 0  . doxylamine, Sleep, (UNISOM) 25 MG tablet Take 25 mg by mouth at bedtime as needed for sleep.    Marland Kitchen labetalol (NORMODYNE) 100 MG tablet Take 100 mg by mouth 2 (two) times daily.    . ondansetron (ZOFRAN-ODT) 8 MG disintegrating tablet Take 8 mg by mouth every 8 (eight) hours as needed for nausea or vomiting.     . Prenatal Vit-Fe Fumarate-FA (PRENATAL MULTIVITAMIN) TABS tablet Take 1 tablet by mouth at bedtime.     . ranitidine (ZANTAC) 150 MG tablet Take 150 mg by mouth 2 (two) times daily.      Results for orders placed or performed during the hospital encounter of 06/10/17 (from the past 48 hour(s))  CBC     Status: Abnormal   Collection Time: 06/10/17 12:59 AM  Result Value Ref Range   WBC 10.4 4.0 - 10.5 K/uL   RBC 4.14 3.87 - 5.11 MIL/uL   Hemoglobin 10.5 (L) 12.0 - 15.0 g/dL   HCT 32.1 (L) 36.0 - 46.0 %   MCV 77.5 (L)  78.0 - 100.0 fL   MCH 25.4 (L) 26.0 - 34.0 pg   MCHC 32.7 30.0 - 36.0 g/dL   RDW 13.7 11.5 - 15.5 %   Platelets 280 150 - 400 K/uL  Type and screen [place]     Status: None   Collection Time: 06/10/17 12:59 AM  Result Value Ref Range   ABO/RH(D) A NEG    Antibody Screen NEG    Sample Expiration 06/13/2017   RPR     Status: None   Collection Time: 06/10/17 12:59 AM  Result Value Ref Range   RPR Ser Ql Non Reactive Non Reactive    Comment: (NOTE) Performed At: [facility] [place], [place] [id number] Lindon Romp MD JS:[id number]   Comprehensive metabolic panel     Status: Abnormal   Collection Time: 06/10/17 12:59 AM  Result Value Ref Range   Sodium 136 135 - 145 mmol/L   Potassium 4.4 3.5 - 5.1 mmol/L   Chloride 106 101 - 111 mmol/L   CO2 21 (L) 22 - 32 mmol/L   Glucose, Bld 93 65 - 99 mg/dL   BUN 7 6 - 20 mg/dL   Creatinine, Ser 0.51  0.44 - 1.00 mg/dL   Calcium 9.4 8.9 - 10.3 mg/dL   Total Protein 6.2 (L) 6.5 - 8.1 g/dL   Albumin 2.6 (L) 3.5 - 5.0 g/dL   AST 28 15 - 41 U/L   ALT 30 14 - 54 U/L   Alkaline Phosphatase 128 (H) 38 - 126 U/L   Total Bilirubin 0.3 0.3 - 1.2 mg/dL   GFR calc non Af Amer >60 >60 mL/min   GFR calc Af Amer >60 >60 mL/min    Comment: (NOTE) The eGFR has been calculated using the CKD EPI equation. This calculation has not been validated in all clinical situations. eGFR's persistently <60 mL/min signify possible Chronic Kidney Disease.    Anion gap 9 5 - 15  Rh IG workup (includes ABO/Rh)     Status: None (Preliminary result)   Collection Time: 06/11/17  5:59 AM  Result Value Ref Range   Gestational Age(Wks) 37    ABO/RH(D) A NEG    Fetal Screen NEG    Unit Number [id number]    Blood Component Type RHIG    Unit division 00    Status of Unit ISSUED    Transfusion Status OK TO TRANSFUSE   CBC     Status: Abnormal   Collection Time: 06/11/17  6:02 AM  Result Value Ref Range   WBC 10.3 4.0 - 10.5 K/uL   RBC 3.80 (L) 3.87 - 5.11 MIL/uL   Hemoglobin 9.5 (L) 12.0 - 15.0 g/dL   HCT 29.4 (L) 36.0 - 46.0 %   MCV 77.4 (L) 78.0 - 100.0 fL   MCH 25.0 (L) 26.0 - 34.0 pg   MCHC 32.3 30.0 - 36.0 g/dL   RDW 13.5 11.5 - 15.5 %   Platelets 253 150 - 400 K/uL  Comprehensive metabolic panel     Status: Abnormal   Collection Time: 06/11/17  6:02 AM  Result Value Ref Range   Sodium 133 (L) 135 - 145 mmol/L   Potassium 3.9 3.5 - 5.1 mmol/L   Chloride 104 101 - 111 mmol/L   CO2 21 (L) 22 - 32 mmol/L   Glucose, Bld 89 65 - 99 mg/dL   BUN 7 6 - 20 mg/dL   Creatinine, Ser 0.55 0.44 - 1.00 mg/dL  Calcium 7.7 (L) 8.9 - 10.3 mg/dL   Total Protein 5.7 (L) 6.5 - 8.1 g/dL   Albumin 2.3 (L) 3.5 - 5.0 g/dL   AST 19 15 - 41 U/L   ALT 22 14 - 54 U/L   Alkaline Phosphatase 104 38 - 126 U/L   Total Bilirubin 0.5 0.3 - 1.2 mg/dL   GFR calc non Af Amer >60 >60 mL/min   GFR calc Af Amer >60 >60 mL/min     Comment: (NOTE) The eGFR has been calculated using the CKD EPI equation. This calculation has not been validated in all clinical situations. eGFR's persistently <60 mL/min signify possible Chronic Kidney Disease.    Anion gap 8 5 - 15   No results found.  Review Of Systems Constitutional: No fever, chills, weight loss or gain. Eyes: No vision change, No discharge or pain. Ears: No hearing loss, No tinnitus. Respiratory: No asthma, COPD, pneumonias. No shortness of breath. No hemoptysis. Cardiovascular: No chest pain, palpitation, leg edema. Gastrointestinal: No nausea, vomiting, diarrhea, constipation. No GI bleed. No hepatitis. Genitourinary: No dysuria, hematuria, kidney stone. No incontinance. Neurological: No headache, stroke, seizures.  Psychiatry: No psych facility admission for anxiety, depression, suicide. No detox. Skin: No rash. Musculoskeletal: No joint pain, fibromyalgia. No neck pain, back pain. Lymphadenopathy: No lymphadenopathy. Hematology: No anemia or easy bruising.   Blood pressure 131/72, pulse (!) 163, temperature (!) 97.5 F (36.4 C), temperature source Oral, resp. rate 18, height _0  (1.651 m), weight (!) 149.7 kg (330 lb), last menstrual period 03/18/2016, SpO2 100 %, unknown if currently breastfeeding. Body mass index is 54.91 kg/m. General appearance: alert, cooperative, appears stated age and no distress Head: Normocephalic, atraumatic. Eyes: Blue eyes, pink conjunctiva, corneas clear. PERRL, EOM's intact. Neck: No adenopathy, no carotid bruit, no JVD, supple, symmetrical, trachea midline and thyroid not enlarged. Resp: Clear to auscultation bilaterally. Cardio: Tachycardic, S1, S2 normal, II/VI systolic murmur, no click, rub or gallop GI: Soft, non-tender; bowel sounds normal; no organomegaly. Extremities: 2 + edema, no cyanosis or clubbing. Skin: Warm and dry.  Neurologic: Alert and oriented X 3, normal strength. Normal coordination and  gait.  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 IV metoprolol. DC Labetalol. Small dose IV lasix. Echocardiogram for LV function.  Birdie Riddle, MD  06/11/2017, 8:08 PM

## 2017-06-11 NOTE — Progress Notes (Signed)
Dr. Rana Snare notified of 12 lead EKG and heart rate of 168.  Dr. Rana Snare will pursue cardiology consult.

## 2017-06-12 ENCOUNTER — Inpatient Hospital Stay (HOSPITAL_COMMUNITY): Payer: Managed Care, Other (non HMO)

## 2017-06-12 DIAGNOSIS — O903 Peripartum cardiomyopathy: Secondary | ICD-10-CM | POA: Diagnosis present

## 2017-06-12 LAB — COMPREHENSIVE METABOLIC PANEL
ALT: 21 U/L (ref 14–54)
AST: 19 U/L (ref 15–41)
Albumin: 2.4 g/dL — ABNORMAL LOW (ref 3.5–5.0)
Alkaline Phosphatase: 105 U/L (ref 38–126)
Anion gap: 8 (ref 5–15)
BUN: 11 mg/dL (ref 6–20)
CO2: 24 mmol/L (ref 22–32)
Calcium: 8.9 mg/dL (ref 8.9–10.3)
Chloride: 107 mmol/L (ref 101–111)
Creatinine, Ser: 0.76 mg/dL (ref 0.44–1.00)
GFR calc Af Amer: >60 mL/min (ref >60)
GFR calc non Af Amer: >60 mL/min (ref >60)
Glucose, Bld: 92 mg/dL (ref 65–99)
Potassium: 4.3 mmol/L (ref 3.5–5.1)
Sodium: 139 mmol/L (ref 135–145)
Total Bilirubin: 0.4 mg/dL (ref 0.3–1.2)
Total Protein: 6.4 g/dL — ABNORMAL LOW (ref 6.5–8.1)

## 2017-06-12 LAB — RH IG WORKUP (INCLUDES ABO/RH)
ABO/RH(D): A NEG
Fetal Screen: NEGATIVE
Gestational Age(Wks): 37
Unit division: 0

## 2017-06-12 LAB — CBC
HCT: 31.2 % — ABNORMAL LOW (ref 36.0–46.0)
Hemoglobin: 10 g/dL — ABNORMAL LOW (ref 12.0–15.0)
MCH: 25.2 pg — ABNORMAL LOW (ref 26.0–34.0)
MCHC: 32.1 g/dL (ref 30.0–36.0)
MCV: 78.6 fL (ref 78.0–100.0)
Platelets: 280 10*3/uL (ref 150–400)
RBC: 3.97 MIL/uL (ref 3.87–5.11)
RDW: 13.9 % (ref 11.5–15.5)
WBC: 11.3 10*3/uL — ABNORMAL HIGH (ref 4.0–10.5)

## 2017-06-12 LAB — ECHOCARDIOGRAM COMPLETE
Height: 65 in
Weight: 5280 oz

## 2017-06-12 LAB — MRSA PCR SCREENING: MRSA by PCR: NEGATIVE

## 2017-06-12 MED ORDER — METOPROLOL TARTRATE 25 MG PO TABS
25.0000 mg | ORAL_TABLET | Freq: Four times a day (QID) | ORAL | Status: DC
  Administered 2017-06-12 – 2017-06-13 (×3): 25 mg via ORAL
  Filled 2017-06-12 (×3): qty 1

## 2017-06-12 MED ORDER — METOPROLOL TARTRATE 5 MG/5ML IV SOLN
2.5000 mg | INTRAVENOUS | Status: DC | PRN
  Administered 2017-06-12: 2.5 mg via INTRAVENOUS
  Administered 2017-06-12: 5 mg via INTRAVENOUS
  Filled 2017-06-12 (×4): qty 5

## 2017-06-12 MED ORDER — ALBUMIN HUMAN 25 % IV SOLN
12.5000 g | Freq: Once | INTRAVENOUS | Status: AC
  Administered 2017-06-12: 12.5 g via INTRAVENOUS
  Filled 2017-06-12: qty 50

## 2017-06-12 MED ORDER — FUROSEMIDE 10 MG/ML IJ SOLN
20.0000 mg | Freq: Once | INTRAMUSCULAR | Status: AC
  Administered 2017-06-12: 20 mg via INTRAVENOUS
  Filled 2017-06-12: qty 2

## 2017-06-12 MED ORDER — METOPROLOL TARTRATE 5 MG/5ML IV SOLN
5.0000 mg | Freq: Once | INTRAVENOUS | Status: AC
  Administered 2017-06-12: 5 mg via INTRAVENOUS
  Filled 2017-06-12: qty 5

## 2017-06-12 MED ORDER — METOPROLOL TARTRATE 5 MG/5ML IV SOLN
2.5000 mg | Freq: Once | INTRAVENOUS | Status: AC
  Administered 2017-06-12: 2.5 mg via INTRAVENOUS
  Filled 2017-06-12: qty 5

## 2017-06-12 MED ORDER — FUROSEMIDE 20 MG PO TABS
20.0000 mg | ORAL_TABLET | Freq: Every day | ORAL | Status: DC
  Administered 2017-06-13: 20 mg via ORAL
  Filled 2017-06-12: qty 1

## 2017-06-12 NOTE — Consult Note (Signed)
Ref: Patient, No Pcp Per   Subjective:  Episodes of tachycardia. Fair diuresis with small dose of lasix. LV function is down with 45-50 % EF.  Objective:  Vital Signs in the last 24 hours: Temp:  [97.5 F (36.4 C)-98.4 F (36.9 C)] 97.8 F (36.6 C) (10/04 1140) Pulse Rate:  [73-173] 87 (10/04 1140) Cardiac Rhythm: Normal sinus rhythm (10/04 1500) Resp:  [18-26] 20 (10/04 1140) BP: (125-160)/(72-98) 129/80 (10/04 1250) SpO2:  [97 %-100 %] 100 % (10/04 1140)  Physical Exam: BP Readings from Last 1 Encounters:  06/12/17 129/80    Wt Readings from Last 1 Encounters:  06/10/17 (!) 149.7 kg (330 lb) (>99 %, Z= 2.97)*   * Growth percentiles are based on CDC 2-20 Years data.    Weight change:  Body mass index is 54.91 kg/m. HEENT: [place]/AT, Eyes-Blue, PERL, EOMI, Conjunctiva-Pink, Sclera-Non-icteric Neck: No JVD, No bruit, Trachea midline. Lungs:  Clear, Bilateral. Cardiac:  Regular rhythm, normal S1 and S2, no S3. II/VI systolic murmur. Abdomen:  Soft, non-tender. BS present. Extremities:  1 + edema present. No cyanosis. No clubbing. CNS: AxOx3, Cranial nerves grossly intact, moves all 4 extremities.  Skin: Warm and dry.   Intake/Output from previous day: 10/03 0701 - 10/04 0700 In: 2280 [P.O.:2280] Out: 3095 [Urine:3095]    Lab Results: BMET    Component Value Date/Time   NA 139 06/12/2017 0843   NA 133 (L) 06/11/2017 0602   NA 136 06/10/2017 0059   K 4.3 06/12/2017 0843   K 3.9 06/11/2017 0602   K 4.4 06/10/2017 0059   CL 107 06/12/2017 0843   CL 104 06/11/2017 0602   CL 106 06/10/2017 0059   CO2 24 06/12/2017 0843   CO2 21 (L) 06/11/2017 0602   CO2 21 (L) 06/10/2017 0059   GLUCOSE 92 06/12/2017 0843   GLUCOSE 89 06/11/2017 0602   GLUCOSE 93 06/10/2017 0059   BUN 11 06/12/2017 0843   BUN 7 06/11/2017 0602   BUN 7 06/10/2017 0059   CREATININE 0.76 06/12/2017 0843   CREATININE 0.55 06/11/2017 0602   CREATININE 0.51 06/10/2017 0059   CALCIUM 8.9 06/12/2017  0843   CALCIUM 7.7 (L) 06/11/2017 0602   CALCIUM 9.4 06/10/2017 0059   GFRNONAA >60 06/12/2017 0843   GFRNONAA >60 06/11/2017 0602   GFRNONAA >60 06/10/2017 0059   GFRAA >60 06/12/2017 0843   GFRAA >60 06/11/2017 0602   GFRAA >60 06/10/2017 0059   CBC    Component Value Date/Time   WBC 11.3 (H) 06/12/2017 0843   RBC 3.97 06/12/2017 0843   HGB 10.0 (L) 06/12/2017 0843   HCT 31.2 (L) 06/12/2017 0843   PLT 280 06/12/2017 0843   MCV 78.6 06/12/2017 0843   MCH 25.2 (L) 06/12/2017 0843   MCHC 32.1 06/12/2017 0843   RDW 13.9 06/12/2017 0843   LYMPHSABS 2.1 12/12/2016 2132   MONOABS 0.4 12/12/2016 2132   EOSABS 0.1 12/12/2016 2132   BASOSABS 0.0 12/12/2016 2132   HEPATIC Function Panel  Recent Labs  06/10/17 0059 06/11/17 0602 06/12/17 0843  PROT 6.2* 5.7* 6.4*   HEMOGLOBIN A1C No components found for: HGA1C,  MPG CARDIAC ENZYMES No results found for: CKTOTAL, CKMB, CKMBINDEX, TROPONINI BNP No results for input(s): PROBNP in the last 8760 hours. TSH No results for input(s): TSH in the last 8760 hours. CHOLESTEROL No results for input(s): CHOL in the last 8760 hours.  Scheduled Meds: . famotidine  20 mg Oral BID  . furosemide  20 mg Intravenous  Once  . ibuprofen  600 mg Oral Q6H  . Influenza vac split quadrivalent PF  0.5 mL Intramuscular Tomorrow-1000  . metoprolol tartrate  5 mg Intravenous Q6H  . prenatal multivitamin  1 tablet Oral Q1200  . scopolamine  1 patch Transdermal Once  . senna-docusate  2 tablet Oral Q24H  . simethicone  80 mg Oral TID PC  . simethicone  80 mg Oral Q24H  . Tdap  0.5 mL Intramuscular Once   Continuous Infusions: . lactated ringers Stopped (06/11/17 1000)  . naLOXone [facility]) adult infusion for PRURITIS     PRN Meds:.acetaminophen, coconut oil, witch hazel-glycerin **AND** dibucaine, diphenhydrAMINE **OR** diphenhydrAMINE, diphenhydrAMINE, menthol-cetylpyridinium, meperidine (DEMEROL) injection, meperidine (DEMEROL) injection,  metoprolol tartrate, nalbuphine **OR** nalbuphine, nalbuphine **OR** nalbuphine, naLOXone (NARCAN) adult infusion for PRURITIS, naloxone **AND** sodium chloride flush, ondansetron (ZOFRAN) IV, oxyCODONE, oxyCODONE, simethicone, zolpidem  Assessment/Plan: Paroxysmal SVT Post partum cardiomyopathy  Continue diuresis as tolerated.    LOS: 2 days    Orpah Cobb  MD  06/12/2017, 4:27 PM

## 2017-06-12 NOTE — Progress Notes (Signed)
Report given to Lenise Arena on 2C at [facility].  [facility] en route for transfer.

## 2017-06-12 NOTE — Progress Notes (Signed)
Walked in room and 2 nurses at bedside - patient in SVT and feeling fluttering.  BP (!) 148/87 (BP Location: Right Arm)   Pulse 82   Temp 98.4 F (36.9 C) (Oral)   Resp 20   Ht  (1.651 m)   Wt (!) 149.7 kg (330 lb)   LMP 03/18/2016   SpO2 100%   Breastfeeding? Unknown   BMI 54.91 kg/m   Pulse now 175  Patient does not appear to be breathless or uncomfortable Abdomen PICO in place green light is on Bandage appears to be dry  No results found for this or any previous visit (from the past 24 hour(s)).  POD # 2  Preeclampsia SVT   Appreciate critical care and Cardiology care Patient just given IV Metoprolol Will call cardiology now about run of SVT ECHO to be performed today Otherwise routine care  Continue telemetry

## 2017-06-12 NOTE — Progress Notes (Signed)
eLink Physician-Brief Progress Note Patient Name: BRIONA KORPELA DOB: 12/31/1995 MRN: [id number]   Date of Service  06/12/2017  HPI/Events of Note  PSVT  eICU Interventions  PRN metoprolol to maintain HR < 125/min     Intervention Category Major Interventions: Arrhythmia - evaluation and management  Merwyn Katos 06/12/2017, 2:33 AM

## 2017-06-12 NOTE — Progress Notes (Signed)
Reviewed ECHO report. Discussed with Dr. Algie Coffer and discussed possible transfer to [facility] to step down unit since this is not technically an ICU.  Dr. Algie Coffer agreed and he will place transfer orders to [facility].

## 2017-06-12 NOTE — Progress Notes (Signed)
  Echocardiogram 2D Echocardiogram has been performed.  Leta Jungling M 06/12/2017, 10:03 AM

## 2017-06-12 NOTE — Progress Notes (Signed)
Patient in and out of SVT this morning.  At [phone] the Patient entered SVT- ice was applied to the face and vagal maneuvers began.  2.5 mg metoprolol given at 0802.  0810 Dr. Vincente Poli at bedside [phone] the on call MD with [facility] was informed of patient's status.  MD responded "Why are you calling me?", instructed to call the cardiologist. [phone] Dr. Amanda Cockayne [facility] called  [facility] Attending), instructed to give  Adenosine.  Code cart brought to room.  Dr. Vincente Poli at bedside, instructed RN to call for Anesthesia assistance.  0102 At this time, patient spontaneously returned to a normal rhythm- adenosine held.  7253 Dr. Amanda Cockayne called this RN back.  Stated not to call him back regarding care of this patient.  This RN reported that the patient had returned to a normal rhythm but was complaining of chest pain.  6644 Radiology called regarding stat Echocardiogram [phone] Patient in SVT, ice applied to face.  Vagal maneuvers preformed [id number] Dr Hart [place] of anesthesia requested to return to bedside.  4259 Patient spontaneously returned to NSR 0846 Dr Donivan Scull returned page regarding patient status. New orders:  Lasix once and 2.5mg  metoprolol once.  5638 Patient in SVT 0915 Dr Vincente Poli called- informed of patient status [phone] Patient IV infiltrated. Prep for new IV site to administer Lasix and Metoprolol 0926 Dr Vincente Poli at bedside [phone] Rate slowed. Patient experiencing PVCs, slow sinus beats- Picking up to NSR 0930 First IV attempt by Atha Starks RN, would not flush 0932 Successful 2nd IV attempt by Deberah Castle RN [phone]  Lasix given, 2.5mg  Metoprolol given. Echocardiogram started at this time, Dr. Vincente Poli stated to report results directly to her.    Continuing to monitor patient closely at this time.  Will update Dr. Vincente Poli and Dr. Algie Coffer with change in status.

## 2017-06-12 NOTE — Lactation Note (Signed)
This note was copied from a baby's chart. Lactation Consultation Note  Patient Name: Melinda Riddle [id number] Date: 06/12/2017 Reason for consult: Follow-up assessment  With this mom of an early term baby, now 60 hours old, and 37 2/7 weeks CGA, and at 8 % weight loss. The baby is mostly formula fed at this time, mom in and out of SVT. I advised mom to not worry about pumping or BF at this time, and  The dad and MGM are caring for the baby. The MGM stated that they are increasing the amount of formula fed to the baby with each feeding. I praised them for doing a great job, and to attempt feeding the baby at least every 3 hours, or with cues. The baby fed 35 ml's of formula at 0800, and tolerated well. I showed parents and MGM how to side lie the baby with feeds. Mom knows to call when feeling better, for help with pumping or breast feeding. Mom's nurse also aware of this.    Maternal Data    Feeding    LATCH Score                   Interventions    Lactation Tools Discussed/Used     Consult Status Consult Status: Follow-up Date: 06/13/17 Follow-up type: In-patient    Alfred Levins 06/12/2017, 8:47 AM

## 2017-06-12 NOTE — Progress Notes (Signed)
Pt transferred via [facility] to [facility] Room 1

## 2017-06-13 LAB — TSH: TSH: 3.103 u[IU]/mL (ref 0.350–4.500)

## 2017-06-13 MED ORDER — POTASSIUM CHLORIDE ER 10 MEQ PO TBCR
10.0000 meq | EXTENDED_RELEASE_TABLET | Freq: Every day | ORAL | Status: DC
  Administered 2017-06-13: 10 meq via ORAL
  Filled 2017-06-13 (×2): qty 1

## 2017-06-13 MED ORDER — METOPROLOL TARTRATE 50 MG PO TABS: 50.0000 mg | ORAL_TABLET | Freq: Two times a day (BID) | ORAL | 3 refills | Status: DC

## 2017-06-13 MED ORDER — DIBUCAINE 1 % RE OINT: 1.0000 "application " | TOPICAL_OINTMENT | RECTAL | 1 refills | Status: DC | PRN

## 2017-06-13 MED ORDER — OXYCODONE-ACETAMINOPHEN 5-325 MG PO TABS: 1.0000 | ORAL_TABLET | ORAL | 0 refills | Status: DC | PRN

## 2017-06-13 MED ORDER — FUROSEMIDE 20 MG PO TABS: 20.0000 mg | ORAL_TABLET | Freq: Every day | ORAL | 3 refills | Status: DC

## 2017-06-13 MED ORDER — SIMETHICONE 80 MG PO CHEW: 80.0000 mg | CHEWABLE_TABLET | Freq: Three times a day (TID) | ORAL | 0 refills | Status: DC | PRN

## 2017-06-13 MED ORDER — WITCH HAZEL-GLYCERIN EX PADS: 1.0000 "application " | MEDICATED_PAD | CUTANEOUS | 1 refills | Status: DC | PRN

## 2017-06-13 MED ORDER — POTASSIUM CHLORIDE ER 10 MEQ PO TBCR: 10.0000 meq | EXTENDED_RELEASE_TABLET | Freq: Every day | ORAL | 3 refills | Status: DC

## 2017-06-13 NOTE — Discharge Instructions (Signed)
Call MD for T>100.4, heavy vaginal bleeding, severe abdominal pain, intractable nausea and/or vomiting, or respiratory distress.  Call office to schedule incision and BP check in 4 days.  No driving while taking narcotics.  Pelvic rest x 6 weeks.

## 2017-06-13 NOTE — Discharge Summary (Signed)
Physician Discharge Summary  Patient ID: Melinda Riddle MRN: [id number] DOB/AGE: [birth year] 19 y.o.  Admit date: 06/10/2017 Discharge date: 06/13/2017  Admission Diagnoses: SVT Preeclampsia R/O postpartum cardiomyopathy  Discharge Diagnoses:  Principle Problem: Postpartum cardiomyopathy Active Problems:   Preeclampsia   Paroxysmal SVT   Anemia of blood loss   Severe hypoproteinemia and hypoalbuminemia   Obesity  Discharged Condition: good  Hospital Course: 21 year old female recently delivered a baby and had episodes of tachycardia and leg edema with shortness of breath. She responded to IV lasix and metoprolol which were changed to oral in 48 hours. Her echocardiogram showed mild LV systolic dysfunction with generalized hypokinesia and EF 45-50 %. She was discharged home in stable condition with follow up by me in 1 month and GYN doctor in 1 week or as arranged.  Consults: cardiology  Significant Diagnostic Studies: labs: CBC with normal WBC and Platelets count but Hgb of 10.3 g. Electrolytes showed mild hyponatremia, improved post diuresis. Low protein of 6.2 and low albumin of 2.7 g.  Treatments: cardiac meds: metoprolol, furosemide and potassium.  Discharge Exam: Blood pressure (!) 141/87, pulse 88, temperature (!) 97.5 F (36.4 C), temperature source Oral, resp. rate (!) 22, height  (1.651 m), weight (!) 149.7 kg (330 lb), last menstrual period 03/18/2016, SpO2 100 %, unknown if currently breastfeeding. General appearance: alert, cooperative and appears stated age. Head: Normocephalic, atraumatic. Eyes: Blue eyes, pink conjunctiva, corneas clear. PERRL, EOM's intact.  Neck: No adenopathy, no carotid bruit, no JVD, supple, symmetrical, trachea midline and thyroid not enlarged. Resp: Clear to auscultation bilaterally. Cardio: Regular rate and rhythm, S1, S2 normal, II/VI systolic murmur, no click, rub or gallop. GI: Soft, non-tender; bowel sounds normal; no  organomegaly. Extremities: Trace edema, cyanosis or clubbing. Skin: Warm and dry.  Neurologic: Alert and oriented X 3, normal strength and tone. Normal coordination and gait.  Disposition: 01-Home or Self Care   Allergies as of 06/13/2017      Reactions   Chlorhexidine Itching, Rash, Other (See Comments)   Reaction:  Burning       Medication List    STOP taking these medications   acetaminophen 500 MG tablet Commonly known as:  TYLENOL   cyclobenzaprine 10 MG tablet Commonly known as:  FLEXERIL   labetalol 100 MG tablet Commonly known as:  NORMODYNE   ondansetron 8 MG disintegrating tablet Commonly known as:  ZOFRAN-ODT     TAKE these medications   dibucaine 1 % Oint Commonly known as:  NUPERCAINAL Place 1 application rectally as needed for hemorrhoids.   doxylamine (Sleep) 25 MG tablet Commonly known as:  UNISOM Take 25 mg by mouth at bedtime as needed for sleep.   furosemide 20 MG tablet Commonly known as:  LASIX Take 1 tablet (20 mg total) by mouth daily.   metoprolol tartrate 50 MG tablet Commonly known as:  LOPRESSOR Take 1 tablet (50 mg total) by mouth 2 (two) times daily.   potassium chloride 10 MEQ tablet Commonly known as:  K-DUR Take 1 tablet (10 mEq total) by mouth daily.   prenatal multivitamin Tabs tablet Take 1 tablet by mouth at bedtime.   ranitidine 150 MG tablet Commonly known as:  ZANTAC Take 150 mg by mouth 2 (two) times daily.   simethicone 80 MG chewable tablet Commonly known as:  MYLICON Chew 1 tablet (80 mg total) by mouth 3 (three) times daily with meals as needed for flatulence.   witch hazel-glycerin pad Commonly known as:  TUCKS Apply 1 application topically as needed for hemorrhoids.      Follow-up Information    Orpah Cobb, MD. Schedule an appointment as soon as possible for a visit in 1 month(s).   Specialty:  Cardiology Contact information: [address] [place] [address] [phone]            Signed: Ricki Rodriguez 06/13/2017, 1:30 PM

## 2017-06-13 NOTE — Progress Notes (Signed)
Subjective: Postpartum Day 3: Cesarean Delivery Patient reports tolerating PO, + flatus and no problems voiding.  No HA, CP/SOB, RUQ pain, or visual disturbance.  No more cardiac symptoms since cardiology started medications.    Objective: Vital signs in last 24 hours: Temp:  [97.5 F (36.4 C)-98.8 F (37.1 C)] 97.5 F (36.4 C) (10/05 0728) Pulse Rate:  [82-104] 88 (10/05 0728) Resp:  [17-26] 22 (10/05 0728) BP: (140-150)/(86-98) 141/87 (10/05 0728) SpO2:  [100 %] 100 % (10/05 0728)  Physical Exam:  General: alert, cooperative and appears stated age Lochia: appropriate Uterine Fundus: firm Incision: healing well, no significant drainage, no dehiscence.  PICO wound vac removed. DVT Evaluation: No evidence of DVT seen on physical exam. Negative Homan's sign. No cords or calf tenderness.   Recent Labs  06/11/17 0602 06/12/17 0843  HGB 9.5* 10.0*  HCT 29.4* 31.2*    Assessment/Plan: Status post Cesarean section. Doing well postoperatively.  Preeclampsia-labs wnl.  BP improved with lasix and lopressor. Asymptomatic.   Postpartum cardiomyopathy-See above.  Appreciate cardiology management.  Discharge home with standard precautions and return to clinic in 4-5 days.  Adair Lemar 06/13/2017, 2:28 PM

## 2017-06-13 NOTE — Plan of Care (Signed)
Problem: Education: Goal: Knowledge of disease or condition will improve Outcome: Progressing Metoprolol will help control your BP and heart rate.

## 2017-06-13 NOTE — Care Management Note (Signed)
Case Management Note  Patient Details  Name: Melinda Riddle MRN: [id number] Date of Birth: 06-17-1996  Subjective/Objective:     Pt admitted with presclampsia 1 day status post partum               Action/Plan:  PTA independent from home with family.  Pt has PCP and denied barriers with obtaining/paying for medications.     Expected Discharge Date:  06/13/17               Expected Discharge Plan:  Home/Self Care  In-House Referral:     Discharge planning Services  CM Consult  Post Acute Care Choice:    Choice offered to:     DME Arranged:    DME Agency:     HH Arranged:    HH Agency:     Status of Service:  Completed, signed off  If discussed at [employer] of Stay Meetings, dates discussed:    Additional Comments:  Cherylann Parr, RN 06/13/2017, 1:40 PM

## 2017-06-13 NOTE — Discharge Summary (Signed)
Obstetric Discharge Summary Reason for Admission: induction of labor Prenatal Procedures: none Intrapartum Procedures: cesarean: low cervical, transverse Postpartum Procedures: none Complications-Operative and Postpartum: SVT and postpartum cardiomyopathy  On POD#1, the patient c/o rapid HR and was found to be in SVT.  Cardiology was consulted and did not need to administer adenosine as the patient spontaneously returned to normal rhythm.  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  The patient was started on metoprolol, lasix and KCl.  On POD#3, the patient was discharged home with plans for f/u in OB office in 4 days and with cardiology in 1 month.    Hemoglobin  Date Value Ref Range Status  06/12/2017 10.0 (L) 12.0 - 15.0 g/dL Final   HCT  Date Value Ref Range Status  06/12/2017 31.2 (L) 36.0 - 46.0 % Final    Physical Exam:  General: alert, cooperative and appears stated age 21: appropriate Uterine Fundus: firm Incision: healing well, no significant drainage, no dehiscence DVT Evaluation: No evidence of DVT seen on physical exam. Negative Homan's sign. No cords or calf tenderness.  Discharge Diagnoses: Term Pregnancy-delivered, Preelampsia and SVT and postpartum cardiomyopathy  Discharge Information: Date: 06/13/2017 Activity: pelvic rest Diet: routine Medications: PNV, Percocet and lasix, metoprolol Condition: stable Instructions: refer to practice specific booklet Discharge to: home Follow-up Information    Orpah Cobb, MD. Schedule an appointment as soon as possible for a visit in 1 month(s).   Specialty:  Cardiology Contact information: [address] [place] [address] [phone]           Newborn Data: Live born female  Birth Weight: 6 lb 14.8 oz (3140 g) APGAR: 8, 9  Newborn Delivery   Birth date/time:  06/10/2017 12:23:00 Delivery type:  C-Section, Vacuum Assisted  C-section categorization:   Primary     Home with mother.  Iantha Titsworth 06/13/2017, 2:38 PM

## 2017-06-13 NOTE — Progress Notes (Signed)
Discussed and explained discharge instructions to pt and mom. Prescriptions and f/u appt given. Pt going home withmom. No complaints voiced at this time,

## 2018-07-27 ENCOUNTER — Encounter (HOSPITAL_COMMUNITY): Payer: Self-pay | Admitting: *Deleted

## 2018-07-27 ENCOUNTER — Emergency Department (HOSPITAL_COMMUNITY): Payer: BLUE CROSS/BLUE SHIELD

## 2018-07-27 ENCOUNTER — Other Ambulatory Visit: Payer: Self-pay

## 2018-07-27 ENCOUNTER — Emergency Department (HOSPITAL_COMMUNITY)
Admission: EM | Admit: 2018-07-27 | Discharge: 2018-07-27 | Disposition: A | Discharge status: Home / Self Care | Payer: BLUE CROSS/BLUE SHIELD | Attending: Emergency Medicine | Admitting: Emergency Medicine

## 2018-07-27 DIAGNOSIS — K625 Hemorrhage of anus and rectum: Secondary | ICD-10-CM | POA: Insufficient documentation

## 2018-07-27 DIAGNOSIS — I10 Essential (primary) hypertension: Secondary | ICD-10-CM | POA: Insufficient documentation

## 2018-07-27 DIAGNOSIS — Z87891 Personal history of nicotine dependence: Secondary | ICD-10-CM | POA: Diagnosis not present

## 2018-07-27 DIAGNOSIS — K6289 Other specified diseases of anus and rectum: Secondary | ICD-10-CM

## 2018-07-27 DIAGNOSIS — Z79899 Other long term (current) drug therapy: Secondary | ICD-10-CM | POA: Diagnosis not present

## 2018-07-27 DIAGNOSIS — R1032 Left lower quadrant pain: Secondary | ICD-10-CM | POA: Diagnosis present

## 2018-07-27 LAB — COMPREHENSIVE METABOLIC PANEL
ALT: 29 U/L (ref 0–44)
AST: 34 U/L (ref 15–41)
Albumin: 3.8 g/dL (ref 3.5–5.0)
Alkaline Phosphatase: 80 U/L (ref 38–126)
Anion gap: 7 (ref 5–15)
BUN: 12 mg/dL (ref 6–20)
CO2: 21 mmol/L — ABNORMAL LOW (ref 22–32)
Calcium: 9.2 mg/dL (ref 8.9–10.3)
Chloride: 108 mmol/L (ref 98–111)
Creatinine, Ser: 0.84 mg/dL (ref 0.44–1.00)
GFR calc Af Amer: >60 mL/min (ref >60)
GFR calc non Af Amer: >60 mL/min (ref >60)
Glucose, Bld: 92 mg/dL (ref 70–99)
Potassium: 5.7 mmol/L — ABNORMAL HIGH (ref 3.5–5.1)
Sodium: 136 mmol/L (ref 135–145)
Total Bilirubin: 1 mg/dL (ref 0.3–1.2)
Total Protein: 6.7 g/dL (ref 6.5–8.1)

## 2018-07-27 LAB — CBC WITH DIFFERENTIAL/PLATELET
Abs Immature Granulocytes: 0.02 10*3/uL (ref 0.00–0.07)
Basophils Absolute: 0.1 10*3/uL (ref 0.0–0.1)
Basophils Relative: 1 %
Eosinophils Absolute: 0.1 10*3/uL (ref 0.0–0.5)
Eosinophils Relative: 2 %
HCT: 43.7 % (ref 36.0–46.0)
Hemoglobin: 12.9 g/dL (ref 12.0–15.0)
Immature Granulocytes: 0 %
Lymphocytes Relative: 31 %
Lymphs Abs: 2.9 10*3/uL (ref 0.7–4.0)
MCH: 23.5 pg — ABNORMAL LOW (ref 26.0–34.0)
MCHC: 29.5 g/dL — ABNORMAL LOW (ref 30.0–36.0)
MCV: 79.6 fL — ABNORMAL LOW (ref 80.0–100.0)
Monocytes Absolute: 0.6 10*3/uL (ref 0.1–1.0)
Monocytes Relative: 7 %
Neutro Abs: 5.6 10*3/uL (ref 1.7–7.7)
Neutrophils Relative %: 59 %
Platelets: 284 10*3/uL (ref 150–400)
RBC: 5.49 MIL/uL — ABNORMAL HIGH (ref 3.87–5.11)
RDW: 15.2 % (ref 11.5–15.5)
WBC: 9.4 10*3/uL (ref 4.0–10.5)
nRBC: 0 % (ref 0.0–0.2)

## 2018-07-27 LAB — POC OCCULT BLOOD, ED: Fecal Occult Bld: NEGATIVE

## 2018-07-27 LAB — I-STAT TROPONIN, ED: Troponin i, poc: 0 ng/mL (ref 0.00–0.08)

## 2018-07-27 LAB — LIPASE, BLOOD: Lipase: 24 U/L (ref 11–51)

## 2018-07-27 LAB — I-STAT BETA HCG BLOOD, ED (MC, WL, AP ONLY): I-stat hCG, quantitative: <5 m[IU]/mL (ref <5)

## 2018-07-27 LAB — URINALYSIS, DIPSTICK ONLY
Bilirubin Urine: NEGATIVE
Glucose, UA: NEGATIVE mg/dL
Ketones, ur: NEGATIVE mg/dL
Leukocytes, UA: NEGATIVE
Nitrite: NEGATIVE
Protein, ur: NEGATIVE mg/dL
Specific Gravity, Urine: 1.02 (ref 1.005–1.030)
pH: 5 (ref 5.0–8.0)

## 2018-07-27 LAB — POTASSIUM: Potassium: 3.9 mmol/L (ref 3.5–5.1)

## 2018-07-27 MED ORDER — ONDANSETRON HCL 4 MG PO TABS: 4.0000 mg | ORAL_TABLET | Freq: Once | ORAL | Status: DC

## 2018-07-27 MED ORDER — HYDROCODONE-ACETAMINOPHEN 5-325 MG PO TABS: 1.0000 | ORAL_TABLET | ORAL | 0 refills | Status: DC | PRN

## 2018-07-27 MED ORDER — ONDANSETRON HCL 4 MG/2ML IJ SOLN
4.0000 mg | Freq: Once | INTRAMUSCULAR | Status: AC
  Administered 2018-07-27: 4 mg via INTRAVENOUS
  Filled 2018-07-27: qty 2

## 2018-07-27 MED ORDER — SODIUM CHLORIDE 0.9 % IV BOLUS
1000.0000 mL | Freq: Once | INTRAVENOUS | Status: AC
  Administered 2018-07-27: 1000 mL via INTRAVENOUS

## 2018-07-27 MED ORDER — DOCUSATE SODIUM 100 MG PO CAPS: 100.0000 mg | ORAL_CAPSULE | Freq: Two times a day (BID) | ORAL | 0 refills | Status: DC

## 2018-07-27 MED ORDER — IOHEXOL 300 MG/ML  SOLN
100.0000 mL | Freq: Once | INTRAMUSCULAR | Status: AC | PRN
  Administered 2018-07-27: 100 mL via INTRAVENOUS

## 2018-07-27 MED ORDER — HYDROCORTISONE ACETATE 25 MG RE SUPP: 25.0000 mg | Freq: Two times a day (BID) | RECTAL | 0 refills | Status: DC

## 2018-07-27 NOTE — ED Notes (Signed)
Patient transported to CT 

## 2018-07-27 NOTE — ED Notes (Signed)
ED Provider Masoudi  at bedside for rectal exam.

## 2018-07-27 NOTE — ED Triage Notes (Signed)
Pt is here with abdominal pain, vomiting and blood in stool.  Sent here to r/o GIB.

## 2018-07-27 NOTE — ED Notes (Signed)
Pt back from, CT 

## 2018-07-27 NOTE — ED Provider Notes (Signed)
4:21 PM Assumed care from Dr. Dalene SeltzerSchlossman and Dr. Maryla MorrowMasoudi, please see their note for full history, physical and decision making until this point. In brief this is a 22 y.o. year old female who presented to the ED tonight with Abdominal Pain     LLQ abdominal pain with rebound. No other s/s peritonitis. One episode of vomiting. Some blood and clot in stool. K hemolyzed Pending CT and repeat K. No pain meds yet.   Repeat K, CT OK. No obvious pelvic component. Did apparently have rectal pain on exam, procititis? Will use anusol and stool softeners, short course of pain meds, GI follow up.   Discharge instructions, including strict return precautions for new or worsening symptoms, given. Patient and/or family verbalized understanding and agreement with the plan as described.   Labs, studies and imaging reviewed by myself and considered in medical decision making if ordered. Imaging interpreted by radiology.  Labs Reviewed  CBC WITH DIFFERENTIAL/PLATELET - Abnormal; Notable for the following components:      Result Value   RBC 5.49 (*)    MCV 79.6 (*)    MCH 23.5 (*)    MCHC 29.5 (*)    All other components within normal limits  COMPREHENSIVE METABOLIC PANEL - Abnormal; Notable for the following components:   Potassium 5.7 (*)    CO2 21 (*)    All other components within normal limits  URINALYSIS, DIPSTICK ONLY - Abnormal; Notable for the following components:   Hgb urine dipstick SMALL (*)    All other components within normal limits  LIPASE, BLOOD  POTASSIUM  I-STAT TROPONIN, ED  I-STAT BETA HCG BLOOD, ED ([facility], [facility], [facility] ONLY)    CT ABDOMEN PELVIS W CONTRAST    (Results Pending)    No follow-ups on file.    Marily MemosMesner, Ivanka Kirshner, MD 07/27/18 [phone]

## 2018-07-27 NOTE — ED Notes (Signed)
ED Provider at bedside. 

## 2018-07-27 NOTE — ED Provider Notes (Signed)
[facility] EMERGENCY DEPARTMENT Provider Note   CSN: [id number] Arrival date & time: 07/27/18  1201     History   Chief Complaint Chief Complaint  Patient presents with  . Abdominal Pain    HPI Melinda Riddle is a 22 y.o. female. 22 year old female with past medical history of HTN, pseudotumor cerebri, who referred to emergency [facility] from [facility] today due to 1 week history of blood in her stool and 1 day Hx of abdominal pain. She reports noticing bright red blood on top of her stool with some clot, in last week. Also some blood on the toilet paper. It has occurred about 2 times every day.  She was generaly doing ok except having heart burn in past couple of weeks. This morning she was also developed constant stabbing LLQ abdominal pain with severity of 4-5/10 associated with vomiting. She tried Tylenol but it did not help. She denies any diarrhea or constipation. No weight loss.  Also reports some chest pain with severity of 4-5/10 as well as SOB this morning. She has implant on her arm for contraception and does not get period. She went to urgent care, U/A was done and was negative. She was referred to ED for for further work up.  HPI  Past Medical History:  Diagnosis Date  . Family history of adverse reaction to anesthesia    grandmother is hard to wake up  . History of bronchitis as a child   . History of kidney stones 10/2015  . Hypertension    "since preg has been higher"  . Migraines    takes Propranolol daily and Zanaflex as needed  . PONV (postoperative nausea and vomiting)    hard to wake up  . Pseudotumor cerebri     Patient Active Problem List   Diagnosis Date Noted  . 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 Preeclampsia, severe, third trimester 06/10/2017  . Elevated blood pressure affecting pregnancy, antepartum 03/25/2017  . Pregnant and not yet delivered in first trimester 12/04/2016  . Vomiting 11/01/2015  . Abdominal pain  in female   . Migraine without aura and without status migrainosus, not intractable 06/23/2015  . Episodic tension-type headache, not intractable 03/02/2015  . Morbid obesity (HCC) 11/19/2014  . Idiopathic intracranial hypertension 11/16/2014  . Acanthosis nigricans, acquired 08/18/2014    Past Surgical History:  Procedure Laterality Date  . CESAREAN SECTION N/A 06/10/2017   Procedure: CESAREAN SECTION;  Surgeon: Harold Hedge, MD;  Location: [facility];  Service: Obstetrics;  Laterality: N/A;  . CHOLECYSTECTOMY N/A 12/20/2015   Procedure: LAPAROSCOPIC CHOLECYSTECTOMY;  Surgeon: Abigail Miyamoto, MD;  Location: [facility] OR;  Service: General;  Laterality: N/A;  . TONSILLECTOMY AND ADENOIDECTOMY     31 or 22 years old      OB History    Gravida  1   Para  1   Term  1   Preterm      AB      Living  1     SAB      TAB      Ectopic      Multiple  0   Live Births  1            Home Medications    Prior to Admission medications   Medication Sig Start Date End Date Taking? Authorizing Provider  acetaminophen (TYLENOL) 500 MG tablet Take 500-1,000 mg by mouth every 6 (six) hours as needed (for headaches or pain).  Yes [provider]  doxylamine, Sleep, (UNISOM) 25 MG tablet Take 25 mg by mouth at bedtime as needed for sleep.   Yes [provider]  FLUoxetine (PROZAC) 10 MG capsule Take 10 mg by mouth daily. 06/18/18  Yes [provider]  lisinopril (PRINIVIL,ZESTRIL) 5 MG tablet Take 5 mg by mouth daily. 06/18/18  Yes [provider]  dibucaine (NUPERCAINAL) 1 % OINT Place 1 application rectally as needed for hemorrhoids. Patient not taking: Reported on 07/27/2018 06/13/17   Orpah Cobb, MD  furosemide (LASIX) 20 MG tablet Take 1 tablet (20 mg total) by mouth daily. Patient not taking: Reported on 07/27/2018 06/14/17   Orpah Cobb, MD  metoprolol tartrate (LOPRESSOR) 50 MG tablet Take 1 tablet (50 mg total) by mouth 2 (two)  times daily. Patient not taking: Reported on 07/27/2018 06/13/17   Orpah Cobb, MD  oxyCODONE-acetaminophen (ROXICET) 5-325 MG tablet Take 1-2 tablets by mouth every 4 (four) hours as needed for severe pain. Patient not taking: Reported on 07/27/2018 06/13/17   Mitchel Honour, DO  potassium chloride (K-DUR) 10 MEQ tablet Take 1 tablet (10 mEq total) by mouth daily. Patient not taking: Reported on 07/27/2018 06/14/17   Orpah Cobb, MD  simethicone (MYLICON) 80 MG chewable tablet Chew 1 tablet (80 mg total) by mouth 3 (three) times daily with meals as needed for flatulence. Patient not taking: Reported on 07/27/2018 06/13/17   Orpah Cobb, MD  witch hazel-glycerin (TUCKS) pad Apply 1 application topically as needed for hemorrhoids. Patient not taking: Reported on 07/27/2018 06/13/17   Orpah Cobb, MD    Family History Family History  Problem Relation Age of Onset  . Lung cancer Maternal Grandfather        Died at 32  . Cancer Maternal Grandfather   . Hypertension Mother   . Diabetes Sister   . Heart disease Maternal Grandmother   . Diabetes Maternal Aunt   . Diabetes Maternal Uncle   . Cancer Maternal Uncle     Social History Social History   Tobacco Use  . Smoking status: Former Smoker    Types: Cigarettes  . Smokeless tobacco: Never Used  . Tobacco comment: Mom and step dad smoke outside, pt quit age 35  Substance Use Topics  . Alcohol use: No    Alcohol/week: 0.0 standard drinks  . Drug use: No     Allergies   Chlorhexidine   Review of Systems Review of Systems   Physical Exam Updated Vital Signs BP (!) 156/94   Pulse 88   Temp 98.2 F (36.8 C) (Oral)   Resp 18   SpO2 100%   Physical Exam  Constitutional: She is oriented to person, place, and time. She appears well-developed and well-nourished. She does not appear ill.  HENT:  Head: Normocephalic and atraumatic.  Eyes: Pupils are equal, round, and reactive to light. EOM are normal.  Cardiovascular:  Normal rate, regular rhythm and normal heart sounds.  No murmur heard. Pulmonary/Chest: Effort normal and breath sounds normal. She has no wheezes. She has no rales.  Abdominal: Soft. Normal appearance and bowel sounds are normal. There is tenderness in the left lower quadrant. There is rebound. There is no rigidity and no guarding.  Neurological: She is alert and oriented to person, place, and time.  Skin: Skin is warm and dry.  Psychiatric: She has a normal mood and affect. Her behavior is normal.     ED Treatments / Results  Labs (all labs ordered are listed, but  only abnormal results are displayed) Labs Reviewed  CBC WITH DIFFERENTIAL/PLATELET - Abnormal; Notable for the following components:      Result Value   RBC 5.49 (*)    MCV 79.6 (*)    MCH 23.5 (*)    MCHC 29.5 (*)    All other components within normal limits  COMPREHENSIVE METABOLIC PANEL - Abnormal; Notable for the following components:   Potassium 5.7 (*)    CO2 21 (*)    All other components within normal limits  URINALYSIS, DIPSTICK ONLY - Abnormal; Notable for the following components:   Hgb urine dipstick SMALL (*)    All other components within normal limits  LIPASE, BLOOD  I-STAT TROPONIN, ED  I-STAT BETA HCG BLOOD, ED ([facility], [facility], [facility] ONLY)    EKG None  Radiology No results found.  Procedures Procedures (including critical care time)  Medications Ordered in ED Medications  sodium chloride 0.9 % bolus 1,000 mL (1,000 mLs Intravenous New Bag/Given 07/27/18 1415)  ondansetron (ZOFRAN) injection 4 mg (4 mg Intravenous Given 07/27/18 1415)     Initial Impression / Assessment and Plan / ED Course  I have reviewed the triage vital signs and the nursing notes.  Pertinent labs & imaging results that were available during my care of the patient were reviewed by me and considered in my medical decision making (see chart for details).  22 year old female with past medical history of HTN, pseudotumor cerebri,  referred to emergency room from urgent care due to 1 week history of blood in her stool and abdominal pain and vomiting that started today.  Presented with bright red blood per rectum  and LLQ abdominal pain, with  LLQ tenderness on exam. No acute abdomen. Vital stable and HB normal.  Recctal exam: Tenderness. Intact skin tag. No external hemorrhoid, no blood on exam, no mass detected on exam. CT abdomen is pending. (to evaluate any diverticulitis or other intraabdominal abnormality). will follow up the result to go for further plan.  Differential for LLQ pain and vomiting: Ovarian torsion is on differential but less likely considering less severity of the pain. But will do ultrasound if CT scan suspicious. Also checked B HCG to evaluate for EP, it came back negative. Hyperkalemia: Sample hemolyzed. Will repeat K.      CBC Latest Ref Rng & Units 07/27/2018 06/12/2017 06/11/2017  WBC 4.0 - 10.5 K/uL 9.4 11.3(H) 10.3  Hemoglobin 12.0 - 15.0 g/dL 16.112.9 10.0(L) 9.5(L)  Hematocrit 36.0 - 46.0 % 43.7 31.2(L) 29.4(L)  Platelets 150 - 400 K/uL 284 280 253    CT abdomen Final Clinical Impressions(s) / ED Diagnoses   Final diagnoses:  None    ED Discharge Orders    None       Chevis PrettyMasoudi, Teofila Bowery, MD 07/27/18 1710    Alvira MondaySchlossman, Erin, MD 07/29/18 [phone]

## 2019-02-22 ENCOUNTER — Other Ambulatory Visit: Payer: Self-pay | Admitting: Obstetrics & Gynecology

## 2019-02-22 DIAGNOSIS — N6320 Unspecified lump in the left breast, unspecified quadrant: Secondary | ICD-10-CM

## 2019-02-25 ENCOUNTER — Ambulatory Visit
Admission: RE | Admit: 2019-02-25 | Discharge: 2019-02-25 | Disposition: A | Discharge status: Home / Self Care | Payer: Medicaid Other | Source: Ambulatory Visit | Attending: Obstetrics & Gynecology | Admitting: Obstetrics & Gynecology

## 2019-02-25 ENCOUNTER — Other Ambulatory Visit: Payer: Self-pay

## 2019-02-25 DIAGNOSIS — N6320 Unspecified lump in the left breast, unspecified quadrant: Secondary | ICD-10-CM

## 2019-08-20 ENCOUNTER — Other Ambulatory Visit: Payer: Self-pay

## 2019-08-20 DIAGNOSIS — Z20822 Contact with and (suspected) exposure to covid-19: Secondary | ICD-10-CM

## 2019-08-22 LAB — NOVEL CORONAVIRUS, NAA: SARS-CoV-2, NAA: NOT DETECTED

## 2019-09-16 ENCOUNTER — Ambulatory Visit: Payer: Medicaid Other | Attending: Internal Medicine

## 2019-09-16 ENCOUNTER — Ambulatory Visit: Payer: Medicaid Other

## 2019-09-16 ENCOUNTER — Other Ambulatory Visit: Payer: Self-pay

## 2019-09-16 DIAGNOSIS — Z20822 Contact with and (suspected) exposure to covid-19: Secondary | ICD-10-CM

## 2019-09-18 ENCOUNTER — Telehealth: Payer: Self-pay | Admitting: General Practice

## 2019-09-18 LAB — NOVEL CORONAVIRUS, NAA: SARS-CoV-2, NAA: NOT DETECTED

## 2019-09-18 NOTE — Telephone Encounter (Signed)
Patient is calling to receive her COVID test results. Patient expressed understanding. 

## 2019-10-30 IMAGING — US ULTRASOUND LEFT BREAST LIMITED
1 series · 2 of 2 positions shown · non-contrast
Comparison: None.

CLINICAL DATA: 23-year-old female complaining focal pain and a
palpable abnormality in the 2 o'clock region of the left breast.

EXAM:
ULTRASOUND OF THE LEFT BREAST

[Series 1: ultrasound left breast limited · 0.07mm/px · 2 of 2 slices shown]
[im 1/2]
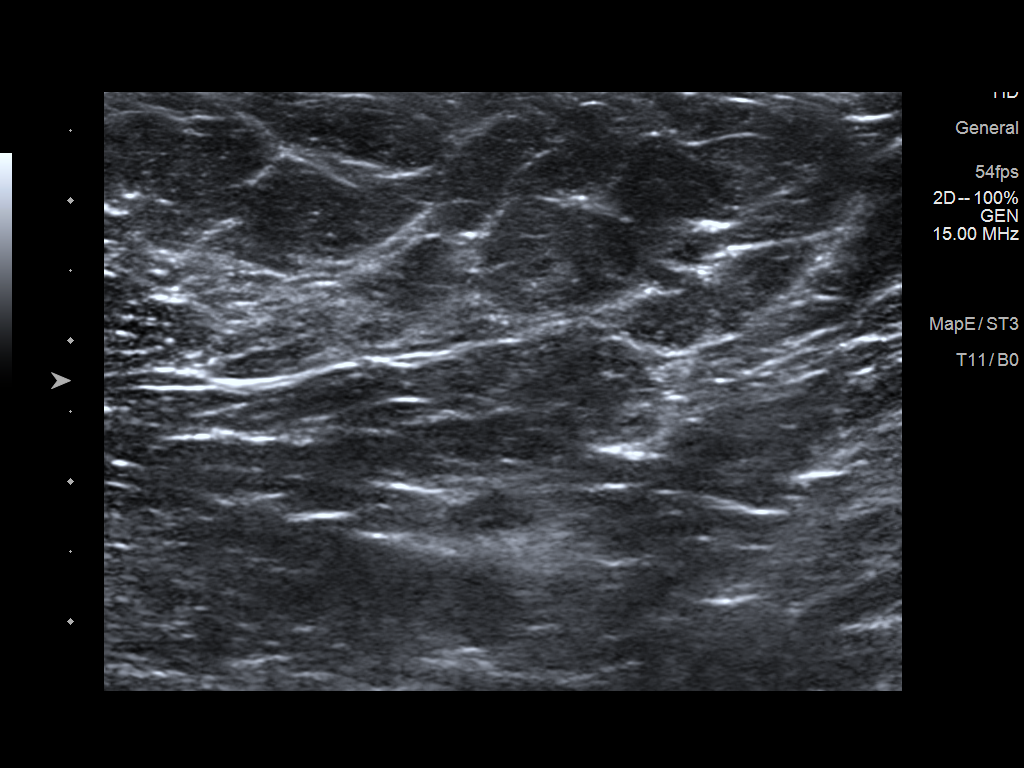
[im 2/2]
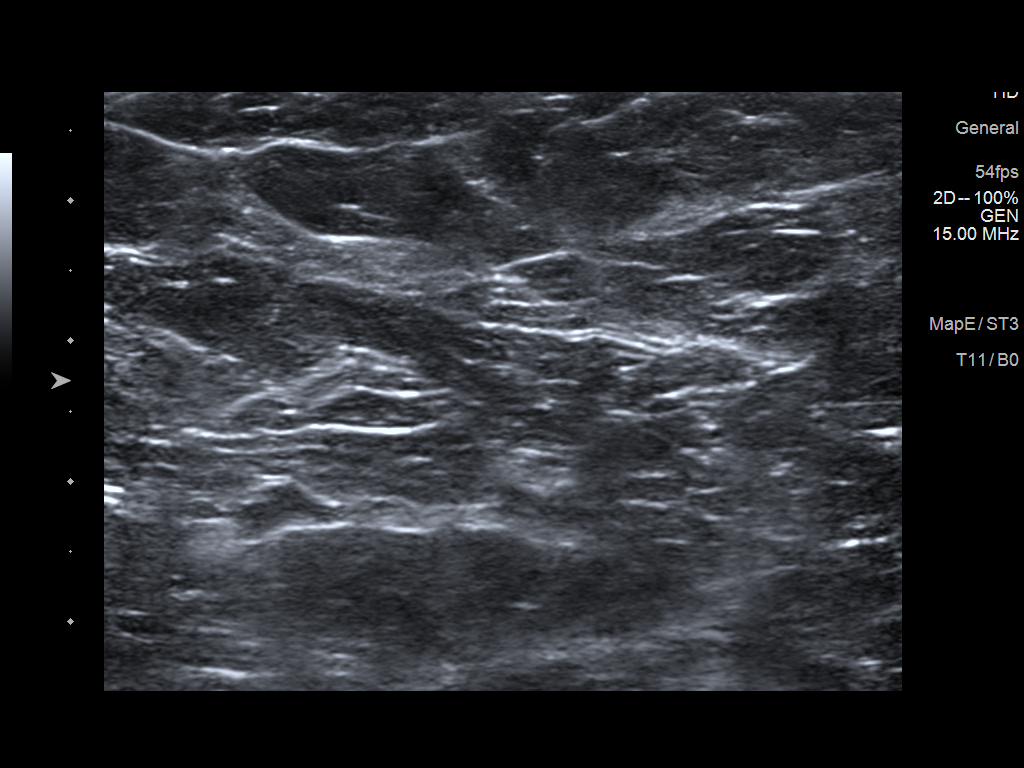

[2 of 2 positions shown; findings below may reference images not displayed]

FINDINGS: On physical exam, I do not palpate a mass in the 2 o'clock region of
the left breast 15 cm from the nipple.

Targeted ultrasound is performed, showing normal tissue in the area
of clinical concern in the 2 o'clock region of the left breast. No
solid or cystic mass, abnormal shadowing or distortion visualized.
IMPRESSION: No sonographic evidence of malignancy in the left breast.

RECOMMENDATION:
If the clinical exam remains benign/stable screening mammography can
be deferred until the age of 40.

I have discussed the findings and recommendations with the patient.
Results were also provided in writing at the conclusion of the
visit. If applicable, a reminder letter will be sent to the patient
regarding the next appointment.

BI-RADS CATEGORY  1: Negative.

## 2019-12-31 ENCOUNTER — Telehealth: Payer: Self-pay

## 2019-12-31 NOTE — Telephone Encounter (Signed)
NOTES ON FILE FROM MEGAN MANSELL MORRIS [phone], SENT REFERRAL TO SCHEDULING 

## 2020-01-16 NOTE — Progress Notes (Signed)
Cardiology Office Note:   Date:  01/18/2020  NAME:  Melinda Riddle    MRN: [id number] DOB:  November 09, 1995   PCP:  Jordan Hawks, PA-C  Cardiologist:  No primary care provider on file.   Referring MD: Mitchel Honour, DO   Chief Complaint  Patient presents with   Loss of Consciousness   History of Present Illness:   Melinda Riddle is a 24 y.o. female with a hx of obesity, peripartum cardiomyopathy?, SVT who is being seen today for the evaluation of syncope at the request of Morris, Megan, DO.  She was diagnosed with a peripartum cardiomyopathy in 2018.  Her ejection fraction was reported to be 45-50%.  She reports she followed infrequently with a cardiologist since that time.  Lisinopril is listed on her medications but she reports she is not taking this.  I informed her this is harmful to baby and she should not take this.  She is currently [redacted] weeks pregnant with her second pregnancy.  She reports she has had 3 episodes of passing out spells.  They have occurred over the last 3 to 4 months.  She reports all episodes have occurred when it is really hot and she is outside.  She reports prolonged standing on her feet.  She reports she feels her heart racing and then she passes out.  She comes to very quickly with no sequela and no incontinence.  She reports that her husband has been with her and she had no alarming symptoms.  She is reported no chest pain or shortness of breath before the episodes.  She reports that she does feel dizzy with the episodes and they are quite predictable.  She reports that she has had a few of them where she can circumvent the passing out spells by sitting down.  She reports that she is staying well-hydrated drink 1 glasses of water per day.  Most recent hemoglobin value 13.0.  No recent TSH in her system.  There is a questionable diagnosis of SVT.  Apparently the cardiologist in 2018 did diagnose her with this.  She is on no heart medications and has had no procedures  for this from what I can tell.  She does not consume excess caffeine.  She is not exercising routinely but has no limitations such as chest pain or shortness of breath.  There is no stigmata of heart failure on examination today.  Her current pregnancy is going well without any major issues.  Overall, she is concerned as well as her obstetrician.  Problem List 1. Peripartum cardiomyopathy? -EF 45-50% in 2018 (unable to view images) 2. SVT  Past Medical History: Past Medical History:  Diagnosis Date   Family history of adverse reaction to anesthesia    grandmother is hard to wake up   History of bronchitis as a child    History of kidney stones 10/2015   Hypertension    "since preg has been higher"   Migraines    takes Propranolol daily and Zanaflex as needed   PONV (postoperative nausea and vomiting)    hard to wake up   Pseudotumor cerebri     Past Surgical History: Past Surgical History:  Procedure Laterality Date   CESAREAN SECTION N/A 06/10/2017   Procedure: CESAREAN SECTION;  Surgeon: Harold Hedge, MD;  Location: [facility] BIRTHING SUITES;  Service: Obstetrics;  Laterality: N/A;   CHOLECYSTECTOMY N/A 12/20/2015   Procedure: LAPAROSCOPIC CHOLECYSTECTOMY;  Surgeon: Abigail Miyamoto, MD;  Location: [facility] OR;  Service:  General;  Laterality: N/A;   GASTRIC BYPASS     TONSILLECTOMY AND ADENOIDECTOMY     23 or 24 years old     Current Medications: Current Meds  Medication Sig   acetaminophen (TYLENOL) 500 MG tablet Take 500-1,000 mg by mouth every 6 (six) hours as needed (for headaches or pain).   docusate sodium (COLACE) 100 MG capsule Take 1 capsule (100 mg total) by mouth every 12 (twelve) hours.   doxylamine, Sleep, (UNISOM) 25 MG tablet Take 25 mg by mouth at bedtime as needed for sleep.   FLUoxetine (PROZAC) 10 MG capsule Take 10 mg by mouth daily.   HYDROcodone-acetaminophen (NORCO/VICODIN) 5-325 MG tablet Take 1 tablet by mouth every 4 (four) hours as needed.    hydrocortisone (ANUSOL-HC) 25 MG suppository Place 1 suppository (25 mg total) rectally 2 (two) times daily. For 7 days     Allergies:    Chlorhexidine   Social History: Social History   Socioeconomic History   Marital status: Single    Spouse name: Not on file   Number of children: 2   Years of education: Not on file   Highest education level: Not on file  Occupational History   Not on file  Tobacco Use   Smoking status: Former Smoker    Types: Cigarettes   Smokeless tobacco: Never Used   Tobacco comment: Mom and step dad smoke outside, pt quit age 24  Substance and Sexual Activity   Alcohol use: No    Alcohol/week: 0.0 standard drinks   Drug use: No   Sexual activity: Yes  Other Topics Concern   Not on file  Social History Narrative   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   She attended [employer].   She lives with her mother, sister, and step-father.    She enjoys walking in the park, swimming, hanging out with friends, and going to football games.    Social Determinants of Health   Financial Resource Strain:    Difficulty of Paying Living Expenses:   Food Insecurity:    Worried About Charity fundraiser in the Last Year:    Arboriculturist in the Last Year:   Transportation Needs:    Film/video editor (Medical):    Lack of Transportation (Non-Medical):   Physical Activity:    Days of Exercise per Week:    Minutes of Exercise per Session:   Stress:    Feeling of Stress :   Social Connections:    Frequency of Communication with Friends and Family:    Frequency of Social Gatherings with Friends and Family:    Attends Religious Services:    Active Member of Clubs or Organizations:    Attends Music therapist:    Marital Status:      Family History: The patient's family history includes Cancer in her maternal grandfather and maternal uncle; Diabetes in her maternal aunt, maternal uncle, and  sister; Heart disease in her maternal grandmother; Hypertension in her mother; Lung cancer in her maternal grandfather.  ROS:   All other ROS reviewed and negative. Pertinent positives noted in the HPI.     EKGs/Labs/Other Studies Reviewed:   The following studies were personally reviewed by me today:  EKG:  EKG is ordered today.  The ekg ordered today demonstrates normal sinus rhythm, heart rate 67, no acute ST-T changes, no evidence for infarction, and was personally reviewed by me.    Recent Labs: No results found  for requested labs within last 8760 hours.   Recent Lipid Panel No results found for: CHOL, TRIG, HDL, CHOLHDL, VLDL, LDLCALC, LDLDIRECT  Physical Exam:   VS:  BP 120/70    Pulse 67    Temp (!) 97.5 F (36.4 C)    Ht 5\' 5"  (1.651 m)    Wt 257 lb (116.6 kg)    SpO2 98%    BMI 42.77 kg/m    Wt Readings from Last 3 Encounters:  01/18/20 257 lb (116.6 kg)  06/10/17 (!) 330 lb (149.7 kg) (>99 %, Z= 2.97)*  06/09/17 (!) 330 lb (149.7 kg) (>99 %, Z= 2.97)*   * Growth percentiles are based on CDC (Girls, 2-20 Years) data.    General: Well nourished, well developed, in no acute distress Heart: Atraumatic, normal size  Eyes: PEERLA, EOMI  Neck: Supple, no JVD Endocrine: No thryomegaly Cardiac: Normal S1, S2; RRR; no murmurs, rubs, or gallops Lungs: Clear to auscultation bilaterally, no wheezing, rhonchi or rales  Abd: Soft, nontender, no hepatomegaly  Ext: No edema, pulses 2+ Musculoskeletal: No deformities, BUE and BLE strength normal and equal Skin: Warm and dry, no rashes   Neuro: Alert and oriented to person, place, time, and situation, CNII-XII grossly intact, no focal deficits  Psych: Normal mood and affect   ASSESSMENT:   Sharvi Mooneyhan is a 24 y.o. female who presents for the following: 1. Syncope and collapse   2. Vasovagal syncope   3. Tachycardia   4. SVT (supraventricular tachycardia) (HCC)     PLAN:   1. Syncope and collapse 2. Vasovagal  syncope 3. Tachycardia 4. SVT (supraventricular tachycardia) (HCC) -EKG today is normal sinus rhythm without any acute changes.  She has no evidence of prior infarct or abnormal intervals.  Her cardiovascular examination today is normal.  I hear no murmurs rubs or gallops.  She has no evidence of clinical heart failure on examination.  Her symptoms and story are classic for vasovagal syncope.  She is able to circumvent the episodes by sitting down.  I recommended she maintain adequate hydration moving forward.  She does need a TSH today.  Given the questionable history of peripartum cardiomyopathy back in 2018 I will repeat an echocardiogram.  She also has a history of SVT and these could be arrhythmic episode triggering this.  We will repeat a 7-day Zio patch to exclude any significant arrhythmia.  If her echocardiogram is normal and her Zio patch is without significant arrhythmia we can be confident there is nothing to worry about here.  In the interim given the classic nature of the vasovagal events she may continue with her current level of activity.  She should maintain adequate hydration.  She should continue to try to circumvent any of these episodes using conservative measures moving forward.  She will let 2019 know if she has more episodes as this is likely as pregnancy moves forward.  We will do our due diligence to exclude any significant cardiac pathology with the above work-up.  Disposition: Return in about 2 months (around 03/19/2020).  Medication Adjustments/Labs and Tests Ordered: Current medicines are reviewed at length with the patient today.  Concerns regarding medicines are outlined above.  Orders Placed This Encounter  Procedures   TSH   LONG TERM MONITOR (3-14 DAYS)   EKG 12-Lead   ECHOCARDIOGRAM COMPLETE   No orders of the defined types were placed in this encounter.   Patient Instructions   Medication Instructions:  Continue same medications  Lab Work: TSH  today    Testing/Procedures: Schedule Echo Schedule 7 day Zio Monitor    Follow-Up: At [facility], you and your health needs are our priority.  As part of our continuing mission to provide you with exceptional heart care, we have created designated Provider Care Teams.  These Care Teams include your primary Cardiologist (physician) and Advanced Practice Providers (APPs -  Physician Assistants and Nurse Practitioners) who all work together to provide you with the care you need, when you need it.  We recommend signing up for the patient portal called "MyChart".  Sign up information is provided on this After Visit Summary.  MyChart is used to connect with patients for Virtual Visits (Telemedicine).  Patients are able to view lab/test results, encounter notes, upcoming appointments, etc.  Non-urgent messages can be sent to your provider as well.   To learn more about what you can do with MyChart, go to [email or web address].    Your next appointment:  2 months   The format for your next appointment: Virtual     Provider: Dr.O'Neal     Signed, Lenna Gilford. Flora Lipps, MD [facility]  [address] [place], [place] [address] [phone]  01/18/2020 9:30 AM

## 2020-01-18 ENCOUNTER — Encounter: Payer: Self-pay | Admitting: Cardiovascular Disease

## 2020-01-18 ENCOUNTER — Other Ambulatory Visit: Payer: Self-pay

## 2020-01-18 ENCOUNTER — Telehealth: Payer: Self-pay | Admitting: Radiology

## 2020-01-18 ENCOUNTER — Ambulatory Visit (INDEPENDENT_AMBULATORY_CARE_PROVIDER_SITE_OTHER): Payer: BC Managed Care – PPO | Admitting: Cardiovascular Disease

## 2020-01-18 VITALS — BP 120/70 | HR 67 | Temp 97.5°F | Ht 65.0 in | Wt 257.0 lb

## 2020-01-18 DIAGNOSIS — R55 Syncope and collapse: Secondary | ICD-10-CM | POA: Diagnosis not present

## 2020-01-18 DIAGNOSIS — R000 Tachycardia, unspecified: Secondary | ICD-10-CM

## 2020-01-18 DIAGNOSIS — I471 Supraventricular tachycardia: Secondary | ICD-10-CM | POA: Diagnosis not present

## 2020-01-18 NOTE — Patient Instructions (Addendum)
  Medication Instructions:  Continue same medications    Lab Work: TSH today    Testing/Procedures: Schedule Echo Schedule 7 day Zio Monitor    Follow-Up: At [facility], you and your health needs are our priority.  As part of our continuing mission to provide you with exceptional heart care, we have created designated Provider Care Teams.  These Care Teams include your primary Cardiologist (physician) and Advanced Practice Providers (APPs -  Physician Assistants and Nurse Practitioners) who all work together to provide you with the care you need, when you need it.  We recommend signing up for the patient portal called "MyChart".  Sign up information is provided on this After Visit Summary.  MyChart is used to connect with patients for Virtual Visits (Telemedicine).  Patients are able to view lab/test results, encounter notes, upcoming appointments, etc.  Non-urgent messages can be sent to your provider as well.   To learn more about what you can do with MyChart, go to [email or web address].    Your next appointment:  2 months   The format for your next appointment: Virtual     Provider: Dr.O'Neal

## 2020-01-18 NOTE — Telephone Encounter (Signed)
Enrolled patient for a 7 day Zio Monitor to be mailed to patients home.  

## 2020-01-19 LAB — TSH: TSH: 1.84 u[IU]/mL (ref 0.450–4.500)

## 2020-01-21 ENCOUNTER — Ambulatory Visit (INDEPENDENT_AMBULATORY_CARE_PROVIDER_SITE_OTHER): Payer: BC Managed Care – PPO

## 2020-01-21 DIAGNOSIS — R000 Tachycardia, unspecified: Secondary | ICD-10-CM

## 2020-01-21 DIAGNOSIS — I471 Supraventricular tachycardia: Secondary | ICD-10-CM | POA: Diagnosis not present

## 2020-01-21 DIAGNOSIS — R55 Syncope and collapse: Secondary | ICD-10-CM | POA: Diagnosis not present

## 2020-02-09 ENCOUNTER — Other Ambulatory Visit (HOSPITAL_COMMUNITY): Payer: BC Managed Care – PPO

## 2020-03-07 ENCOUNTER — Other Ambulatory Visit (HOSPITAL_COMMUNITY): Payer: BC Managed Care – PPO

## 2020-03-07 ENCOUNTER — Telehealth (HOSPITAL_COMMUNITY): Payer: Self-pay | Admitting: Cardiovascular Disease

## 2020-03-07 NOTE — Telephone Encounter (Signed)
Noted. Thanks.  Will notify MD.   Lorain Childes.

## 2020-03-07 NOTE — Telephone Encounter (Signed)
Patient No Showed echo on 02/09/2020 and called this am and cancelled appointment for 03/07/2020 the day of echocardiogram.  Patient states she is sick and does not wish to reschedule the echocardiogram at this time.  Order will be removed from the WQ and if she calls back to reschedule we can reinstate the order.

## 2020-03-26 NOTE — Progress Notes (Deleted)
Cardiology Telehealth Visit    {Choose 1 Note Type (Telehealth Visit or Telephone Visit):[phone]}   Date:  03/26/2020  NAME:  Melinda Riddle    MRN: [id number] DOB:  [birth year]  {Patient Location:[phone]::"Home"} {Provider Location:[phone]::"Home Office"}  PCP:  Jordan Hawks, PA-C  Cardiologist:  No primary care provider on file. *** Electrophysiologist:  None   Evaluation Performed:  {Choose Visit [phone]::"Follow-Up Visit"}  Chief Complaint:  ***  History of Present Illness:    Melinda Riddle is a 24 y.o. female with history of peripartum cardiomyopathy, SVT who presents for follow-up. She was seen 01/18/2020 for syncope. Reported several episodes of passing out on hot days. Symptoms were consistent with vasovagal syncope. We ordered a monitor which showed no arrhythmias. We ordered echo, but Melinda Riddle has not completed this.    Problem List 1. Peripartum cardiomyopathy? -EF 45-50% in 2018 (unable to view images) 2. SVT  The patient {does/does not:200015} have symptoms concerning for COVID-19 infection (fever, chills, cough, or new shortness of breath).   Past Medical History: Past Medical History:  Diagnosis Date   Family history of adverse reaction to anesthesia    grandmother is hard to wake up   History of bronchitis as a child    History of kidney stones 10/2015   Hypertension    "since preg has been higher"   Migraines    takes Propranolol daily and Zanaflex as needed   PONV (postoperative nausea and vomiting)    hard to wake up   Pseudotumor cerebri     Past Surgical History: Past Surgical History:  Procedure Laterality Date   CESAREAN SECTION N/A 06/10/2017   Procedure: CESAREAN SECTION;  Surgeon: Harold Hedge, MD;  Location: [facility] BIRTHING SUIT[facility];  Service: Obstetrics;  Laterality: N/A;   CHOLECYSTECTOMY N/A 12/20/2015   Procedure: LAPAROSCOPIC CHOLECYSTECTOMY;  Surgeon: Abigail Miyamoto, MD;  Location: [facility] OR;  Service:  General;  Laterality: N/A;   GASTRIC BYPASS     TONSILLECTOMY AND ADENOIDECTOMY     46 or 24 years old     Current Medications: No outpatient medications have been marked as taking for the 03/27/20 encounter (Appointment) with O'Neal, Ronnald Ramp, MD.     Allergies:    Chlorhexidine   Social History: Social History   Socioeconomic History   Marital status: Single    Spouse name: Not on file   Number of children: 2   Years of education: Not on file   Highest education level: Not on file  Occupational History   Not on file  Tobacco Use   Smoking status: Former Smoker    Types: Cigarettes   Smokeless tobacco: Never Used   Tobacco comment: Mom and step dad smoke outside, pt quit age 41  Substance and Sexual Activity   Alcohol use: No    Alcohol/week: 0.0 standard drinks   Drug use: No   Sexual activity: Yes  Other Topics Concern   Not on file  Social History Narrative   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   She lives with her mother, sister, and step-father.    She enjoys walking in the park, swimming, hanging out with friends, and going to football games.    Social Determinants of Health   Financial Resource Strain:    Difficulty of Paying Living Expenses:   Food Insecurity:    Worried About Programme researcher, broadcasting/film/video in the Last Year:    Barista in  the Last Year:   Transportation Needs:    Freight forwarder (Medical):    Lack of Transportation (Non-Medical):   Physical Activity:    Days of Exercise per Week:    Minutes of Exercise per Session:   Stress:    Feeling of Stress :   Social Connections:    Frequency of Communication with Friends and Family:    Frequency of Social Gatherings with Friends and Family:    Attends Religious Services:    Active Member of Clubs or Organizations:    Attends Engineer, structural:    Marital Status:      Family History: The  patient's ***family history includes Cancer in her maternal grandfather and maternal uncle; Diabetes in her maternal aunt, maternal uncle, and sister; Heart disease in her maternal grandmother; Hypertension in her mother; Lung cancer in her maternal grandfather.  ROS:   All other ROS reviewed and negative. Pertinent positives noted in the HPI.     Prior CV studies:   The following studies were reviewed today:  ***  Labs/Other Tests and Data Reviewed:    EKG:  {EKG/Telemetry Strips Reviewed:[phone]}   zio 03/02/2020 Enrollment 01/21/2020-01/26/2020 (5 days 0 hours). Patient had a min HR of 46 bpm (sinus bradycardia), max HR of 176 bpm (sinus tachycardia), and avg HR of 73 bpm (normal sinus rhythm). Predominant underlying rhythm was Sinus Rhythm. Isolated SVEs were rare (<1.0%), and no SVE Couplets or SVE Triplets were present. Isolated VEs were rare (<1.0%), and no VE Couplets or VE Triplets were present. There were 12 patient triggered events that coincided with normal sinus rhythm and sinus tachycardia (83-140 bpm).   Impression:  1. No arrhythmias detected.  2. Rare ectopy.    Recent Labs: 01/18/2020: TSH 1.840   Recent Lipid Panel No results found for: CHOL, TRIG, HDL, CHOLHDL, LDLCALC, LDLDIRECT  Wt Readings from Last 3 Encounters:  01/18/20 257 lb (116.6 kg)  06/10/17 (!) 330 lb (149.7 kg) (>99 %, Z= 2.97)*  06/09/17 (!) 330 lb (149.7 kg) (>99 %, Z= 2.97)*   * Growth percentiles are based on CDC (Girls, 2-20 Years) data.     Objective:    Vital Signs:  There were no vitals taken for this visit.   {[facility] Virtual Exam (Optional):[phone]::"VITAL SIGNS:  reviewed"}  ASSESSMENT & PLAN:    There are no diagnoses linked to this encounter.  COVID-19 Education: The signs and symptoms of COVID-19 were discussed with the patient and how to seek care for testing (follow up with PCP or arrange E-visit).  ***The importance of social distancing was discussed  today.  Time:   Today, I have spent *** minutes with the patient with telehealth technology discussing the above problems.    Medication Adjustments/Labs and Tests Ordered: Current medicines are reviewed at length with the patient today.  Concerns regarding medicines are outlined above.   Tests Ordered: No orders of the defined types were placed in this encounter.   Medication Changes: No orders of the defined types were placed in this encounter.   Follow Up:  {F/U Format:[phone]} {follow up:15908}  Signed, Gerri Spore T. Flora Lipps, MD [facility]   [facility]  03/26/2020 5:10 PM

## 2020-03-27 ENCOUNTER — Telehealth: Payer: BC Managed Care – PPO | Admitting: Cardiovascular Disease

## 2020-04-20 ENCOUNTER — Telehealth (HOSPITAL_COMMUNITY): Payer: Self-pay | Admitting: Cardiovascular Disease

## 2020-04-20 ENCOUNTER — Other Ambulatory Visit (HOSPITAL_COMMUNITY): Payer: BC Managed Care – PPO

## 2020-04-20 NOTE — Telephone Encounter (Signed)
Just an FYI. We have made several attempts to contact this patient including sending a letter to schedule or reschedule their echocardiogram. We will be removing the patient from the echo WQ.   04/20/20 called patient to reschedule and she declined to have test done.  04/20/20 PT NO SHOWED  03/07/2020 Pt cancelled and DNWTR /LBW  PT NO SHOWED 02/09/20     Thank you

## 2020-04-30 NOTE — Progress Notes (Deleted)
Cardiology Office Note:   Date:  04/30/2020  NAME:  Melinda Riddle    MRN: [id number] DOB:  Aug 30, 1996   PCP:  Jordan Hawks, PA-C  Cardiologist:  No primary care provider on file.  Electrophysiologist:  None   Referring MD: Jordan Hawks, PA-C   No chief complaint on file. ***  History of Present Illness:   Melinda Riddle is a 24 y.o. 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 Was evaluated 01/2020 for syncope. Zio without arrhythmia. Did not repeat echo.   Problem List 1. Peripartum cardiomyopathy? -EF 45-50% in 2018 (unable to view images) 2. SVT  Past Medical History: Past Medical History:  Diagnosis Date  . Family history of adverse reaction to anesthesia    grandmother is hard to wake up  . History of bronchitis as a child   . History of kidney stones 10/2015  . Hypertension    "since preg has been higher"  . Migraines    takes Propranolol daily and Zanaflex as needed  . PONV (postoperative nausea and vomiting)    hard to wake up  . Pseudotumor cerebri     Past Surgical History: Past Surgical History:  Procedure Laterality Date  . CESAREAN SECTION N/A 06/10/2017   Procedure: CESAREAN SECTION;  Surgeon: Harold Hedge, MD;  Location: [facility] BIRTHING SUITES;  Service: Obstetrics;  Laterality: N/A;  . CHOLECYSTECTOMY N/A 12/20/2015   Procedure: LAPAROSCOPIC CHOLECYSTECTOMY;  Surgeon: Abigail Miyamoto, MD;  Location: [facility] OR;  Service: General;  Laterality: N/A;  . GASTRIC BYPASS    . TONSILLECTOMY AND ADENOIDECTOMY     82 or 24 years old     Current Medications: No outpatient medications have been marked as taking for the 05/01/20 encounter (Appointment) with O'Neal, Ronnald Ramp, MD.     Allergies:    Chlorhexidine   Social History: Social History   Socioeconomic History  . Marital status: Single    Spouse name: Not on file  . Number of children: 2  . Years of education: Not on file   . Highest education level: Not on file  Occupational History  . Not on file  Tobacco Use  . Smoking status: Former Smoker    Types: Cigarettes  . Smokeless tobacco: Never Used  . Tobacco comment: Mom and step dad smoke outside, pt quit age 71  Substance and Sexual Activity  . Alcohol use: No    Alcohol/week: 0.0 standard drinks  . Drug use: No  . Sexual activity: Yes  Other Topics Concern  . 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   She lives with her mother, sister, and step-father.    She enjoys walking in the park, swimming, hanging out with friends, and going to football games.    Social Determinants of Health   Financial Resource Strain:   . Difficulty of Paying Living Expenses: Not on file  Food Insecurity:   . Worried About Programme researcher, broadcasting/film/video in the Last Year: Not on file  . Ran Out of Food in the Last Year: Not on file  Transportation Needs:   . Lack of Transportation (Medical): Not on file  . Lack of Transportation (Non-Medical): Not on file  Physical Activity:   . Days of Exercise per Week: Not on file  . Minutes of Exercise per Session: Not on file  Stress:   . Feeling of Stress : Not on file  Social Connections:   . Frequency of Communication with Friends and Family: Not on file  . Frequency of Social Gatherings with Friends and Family: Not on file  . Attends Religious Services: Not on file  . Active Member of Clubs or Organizations: Not on file  . Attends Banker Meetings: Not on file  . Marital Status: Not on file     Family History: The patient's ***family history includes Cancer in her maternal grandfather and maternal uncle; Diabetes in her maternal aunt, maternal uncle, and sister; Heart disease in her maternal grandmother; Hypertension in her mother; Lung cancer in her maternal grandfather.  ROS:   All other ROS reviewed and negative. Pertinent  positives noted in the HPI.     EKGs/Labs/Other Studies Reviewed:   The following studies were personally reviewed by me today:  EKG:  EKG is *** ordered today.  The ekg ordered today demonstrates ***, and was personally reviewed by me.   Zio 03/02/2020 1. No arrhythmias detected.  2. Rare ectopy.    Recent Labs: 01/18/2020: TSH 1.840   Recent Lipid Panel No results found for: CHOL, TRIG, HDL, CHOLHDL, VLDL, LDLCALC, LDLDIRECT  Physical Exam:   VS:  There were no vitals taken for this visit.   Wt Readings from Last 3 Encounters:  01/18/20 257 lb (116.6 kg)  06/10/17 (!) 330 lb (149.7 kg) (>99 %, Z= 2.97)*  06/09/17 (!) 330 lb (149.7 kg) (>99 %, Z= 2.97)*   * Growth percentiles are based on CDC (Girls, 2-20 Years) data.    General: Well nourished, well developed, in no acute distress Heart: Atraumatic, normal size  Eyes: PEERLA, EOMI  Neck: Supple, no JVD Endocrine: No thryomegaly Cardiac: Normal S1, S2; RRR; no murmurs, rubs, or gallops Lungs: Clear to auscultation bilaterally, no wheezing, rhonchi or rales  Abd: Soft, nontender, no hepatomegaly  Ext: No edema, pulses 2+ Musculoskeletal: No deformities, BUE and BLE strength normal and equal Skin: Warm and dry, no rashes   Neuro: Alert and oriented to person, place, time, and situation, CNII-XII grossly intact, no focal deficits  Psych: Normal mood and affect   ASSESSMENT:   Melinda Riddle is a 23 y.o. female who presents for the following: No diagnosis found.  PLAN:   There are no diagnoses linked to this encounter.  Disposition: No follow-ups on file.  Medication Adjustments/Labs and Tests Ordered: Current medicines are reviewed at length with the patient today.  Concerns regarding medicines are outlined above.  No orders of the defined types were placed in this encounter.  No orders of the defined types were placed in this encounter.   There are no Patient Instructions on file for this visit.   Time  Spent with Patient: I have spent a total of *** minutes with patient reviewing hospital notes, telemetry, EKGs, labs and examining the patient as well as establishing an assessment and plan that was discussed with the patient.  > 50% of time was spent in direct patient care.  Signed, Lenna Gilford. Flora Lipps, MD [facility]  [address], [address] [place], [place] [address] [phone]  04/30/2020 11:07 AM

## 2020-05-01 ENCOUNTER — Ambulatory Visit: Payer: BC Managed Care – PPO | Admitting: Cardiovascular Disease

## 2020-05-25 ENCOUNTER — Inpatient Hospital Stay (HOSPITAL_COMMUNITY)
Admission: AD | Admit: 2020-05-25 | Discharge: 2020-05-25 | Disposition: A | Discharge status: Home / Self Care | Payer: BC Managed Care – PPO | Attending: Obstetrics and Gynecology | Admitting: Obstetrics and Gynecology

## 2020-05-25 ENCOUNTER — Encounter (HOSPITAL_COMMUNITY): Payer: Self-pay | Admitting: *Deleted

## 2020-05-25 ENCOUNTER — Other Ambulatory Visit: Payer: Self-pay

## 2020-05-25 DIAGNOSIS — Z888 Allergy status to other drugs, medicaments and biological substances status: Secondary | ICD-10-CM | POA: Diagnosis not present

## 2020-05-25 DIAGNOSIS — Z79899 Other long term (current) drug therapy: Secondary | ICD-10-CM | POA: Diagnosis not present

## 2020-05-25 DIAGNOSIS — O368130 Decreased fetal movements, third trimester, not applicable or unspecified: Secondary | ICD-10-CM | POA: Insufficient documentation

## 2020-05-25 DIAGNOSIS — Z98891 History of uterine scar from previous surgery: Secondary | ICD-10-CM | POA: Insufficient documentation

## 2020-05-25 DIAGNOSIS — Z3A32 32 weeks gestation of pregnancy: Secondary | ICD-10-CM | POA: Insufficient documentation

## 2020-05-25 DIAGNOSIS — O34212 Maternal care for vertical scar from previous cesarean delivery: Secondary | ICD-10-CM | POA: Insufficient documentation

## 2020-05-25 DIAGNOSIS — O99213 Obesity complicating pregnancy, third trimester: Secondary | ICD-10-CM | POA: Diagnosis not present

## 2020-05-25 DIAGNOSIS — O99843 Bariatric surgery status complicating pregnancy, third trimester: Secondary | ICD-10-CM | POA: Insufficient documentation

## 2020-05-25 DIAGNOSIS — Z8249 Family history of ischemic heart disease and other diseases of the circulatory system: Secondary | ICD-10-CM | POA: Insufficient documentation

## 2020-05-25 DIAGNOSIS — Z2839 Other underimmunization status: Secondary | ICD-10-CM | POA: Insufficient documentation

## 2020-05-25 DIAGNOSIS — O4703 False labor before 37 completed weeks of gestation, third trimester: Secondary | ICD-10-CM

## 2020-05-25 DIAGNOSIS — O26893 Other specified pregnancy related conditions, third trimester: Secondary | ICD-10-CM | POA: Diagnosis present

## 2020-05-25 DIAGNOSIS — O479 False labor, unspecified: Secondary | ICD-10-CM

## 2020-05-25 DIAGNOSIS — Z9884 Bariatric surgery status: Secondary | ICD-10-CM | POA: Insufficient documentation

## 2020-05-25 DIAGNOSIS — Z87891 Personal history of nicotine dependence: Secondary | ICD-10-CM | POA: Diagnosis not present

## 2020-05-25 DIAGNOSIS — O10913 Unspecified pre-existing hypertension complicating pregnancy, third trimester: Secondary | ICD-10-CM | POA: Insufficient documentation

## 2020-05-25 DIAGNOSIS — Z283 Underimmunization status: Secondary | ICD-10-CM | POA: Insufficient documentation

## 2020-05-25 DIAGNOSIS — Z9049 Acquired absence of other specified parts of digestive tract: Secondary | ICD-10-CM | POA: Insufficient documentation

## 2020-05-25 HISTORY — DX: Supraventricular tachycardia: I47.1

## 2020-05-25 HISTORY — DX: Supraventricular tachycardia, unspecified: I47.10

## 2020-05-25 LAB — URINALYSIS, ROUTINE W REFLEX MICROSCOPIC
Bilirubin Urine: NEGATIVE
Glucose, UA: NEGATIVE mg/dL
Hgb urine dipstick: NEGATIVE
Ketones, ur: NEGATIVE mg/dL
Nitrite: NEGATIVE
Protein, ur: NEGATIVE mg/dL
Specific Gravity, Urine: 1.02 (ref 1.005–1.030)
pH: 6 (ref 5.0–8.0)

## 2020-05-25 LAB — CBC
HCT: 31.9 % — ABNORMAL LOW (ref 36.0–46.0)
Hemoglobin: 10.3 g/dL — ABNORMAL LOW (ref 12.0–15.0)
MCH: 26.3 pg (ref 26.0–34.0)
MCHC: 32.3 g/dL (ref 30.0–36.0)
MCV: 81.6 fL (ref 80.0–100.0)
Platelets: 244 10*3/uL (ref 150–400)
RBC: 3.91 MIL/uL (ref 3.87–5.11)
RDW: 12.9 % (ref 11.5–15.5)
WBC: 8.8 10*3/uL (ref 4.0–10.5)
nRBC: 0 % (ref 0.0–0.2)

## 2020-05-25 LAB — WET PREP, GENITAL
Clue Cells Wet Prep HPF POC: NONE SEEN
Sperm: NONE SEEN
Trich, Wet Prep: NONE SEEN
Yeast Wet Prep HPF POC: NONE SEEN

## 2020-05-25 LAB — COMPREHENSIVE METABOLIC PANEL
ALT: 18 U/L (ref 0–44)
AST: 12 U/L — ABNORMAL LOW (ref 15–41)
Albumin: 2.5 g/dL — ABNORMAL LOW (ref 3.5–5.0)
Alkaline Phosphatase: 95 U/L (ref 38–126)
Anion gap: 10 (ref 5–15)
BUN: <5 mg/dL — ABNORMAL LOW (ref 6–20)
CO2: 19 mmol/L — ABNORMAL LOW (ref 22–32)
Calcium: 8.4 mg/dL — ABNORMAL LOW (ref 8.9–10.3)
Chloride: 109 mmol/L (ref 98–111)
Creatinine, Ser: 0.53 mg/dL (ref 0.44–1.00)
GFR calc Af Amer: >60 mL/min (ref >60)
GFR calc non Af Amer: >60 mL/min (ref >60)
Glucose, Bld: 86 mg/dL (ref 70–99)
Potassium: 3.5 mmol/L (ref 3.5–5.1)
Sodium: 138 mmol/L (ref 135–145)
Total Bilirubin: 0.5 mg/dL (ref 0.3–1.2)
Total Protein: 5.6 g/dL — ABNORMAL LOW (ref 6.5–8.1)

## 2020-05-25 LAB — PROTEIN / CREATININE RATIO, URINE
Creatinine, Urine: 181.65 mg/dL
Protein Creatinine Ratio: 0.15 mg/mg{Cre} (ref 0.00–0.15)
Total Protein, Urine: 27 mg/dL

## 2020-05-25 NOTE — MAU Note (Signed)
Presents with c/o ctxs and pelvic pressure that began this morning.  Also reports losing mucus plug, and every time she wipes there's mucusy duicharge.  Denies VB , but reports possible LOF.  Endorses FM, but less than usual.

## 2020-05-25 NOTE — Discharge Instructions (Signed)
Braxton Hicks Contractions °Contractions of the uterus can occur throughout pregnancy, but they are not always a sign that you are in labor. You may have practice contractions called Braxton Hicks contractions. These false labor contractions are sometimes confused with true labor. °What are Braxton Hicks contractions? °Braxton Hicks contractions are tightening movements that occur in the muscles of the uterus before labor. Unlike true labor contractions, these contractions do not result in opening (dilation) and thinning of the cervix. Toward the end of pregnancy (32-34 weeks), Braxton Hicks contractions can happen more often and may become stronger. These contractions are sometimes difficult to tell apart from true labor because they can be very uncomfortable. You should not feel embarrassed if you go to the hospital with false labor. °Sometimes, the only way to tell if you are in true labor is for your health care provider to look for changes in the cervix. The health care provider will do a physical exam and may monitor your contractions. If you are not in true labor, the exam should show that your cervix is not dilating and your water has not broken. °If there are no other health problems associated with your pregnancy, it is completely safe for you to be sent home with false labor. You may continue to have Braxton Hicks contractions until you go into true labor. °How to tell the difference between true labor and false labor °True labor °· Contractions last 30-70 seconds. °· Contractions become very regular. °· Discomfort is usually felt in the top of the uterus, and it spreads to the lower abdomen and low back. °· Contractions do not go away with walking. °· Contractions usually become more intense and increase in frequency. °· The cervix dilates and gets thinner. °False labor °· Contractions are usually shorter and not as strong as true labor contractions. °· Contractions are usually irregular. °· Contractions  are often felt in the front of the lower abdomen and in the groin. °· Contractions may go away when you walk around or change positions while lying down. °· Contractions get weaker and are shorter-lasting as time goes on. °· The cervix usually does not dilate or become thin. °Follow these instructions at home: ° °· Take over-the-counter and prescription medicines only as told by your health care provider. °· Keep up with your usual exercises and follow other instructions from your health care provider. °· Eat and drink lightly if you think you are going into labor. °· If Braxton Hicks contractions are making you uncomfortable: °? Change your position from lying down or resting to walking, or change from walking to resting. °? Sit and rest in a tub of warm water. °? Drink enough fluid to keep your urine pale yellow. Dehydration may cause these contractions. °? Do slow and deep breathing several times an hour. °· Keep all follow-up prenatal visits as told by your health care provider. This is important. °Contact a health care provider if: °· You have a fever. °· You have continuous pain in your abdomen. °Get help right away if: °· Your contractions become stronger, more regular, and closer together. °· You have fluid leaking or gushing from your vagina. °· You pass blood-tinged mucus (bloody show). °· You have bleeding from your vagina. °· You have low back pain that you never had before. °· You feel your baby’s head pushing down and causing pelvic pressure. °· Your baby is not moving inside you as much as it used to. °Summary °· Contractions that occur before labor are   called Braxton Hicks contractions, false labor, or practice contractions. °· Braxton Hicks contractions are usually shorter, weaker, farther apart, and less regular than true labor contractions. True labor contractions usually become progressively stronger and regular, and they become more frequent. °· Manage discomfort from Braxton Hicks contractions  by changing position, resting in a warm bath, drinking plenty of water, or practicing deep breathing. °This information is not intended to replace advice given to you by your health care provider. Make sure you discuss any questions you have with your health care provider. °Document Revised: 08/08/2017 Document Reviewed: 01/09/2017 °Elsevier Patient Education © 2020 [employer] ° °

## 2020-05-25 NOTE — MAU Provider Note (Signed)
History     [id number]  Arrival date and time: 05/25/20 1534    Chief Complaint  Patient presents with  . Contractions  . Pelvic Pressure     HPI Melinda Riddle is a 24 y.o. at 5068w2d by LMP with PMHx notable for headaches, pseudotumor cerebri, morbid obesity (s/p gastric bypass), prior c/s (2018), history postpartum cardiomyopathy, history preE w/SF who presents pelvic pressure, LOF and DFM. She is unsure if she is having contractions, but states she is having intermittent abdominal pain, not regular or consistent. She endorses pelvic pressure started a few weeks ago, has worsened this morning, worse with walking. She endorses LOF starting 3-4 hours ago. She endorses DFM starting 3-4 hours ago as well, but previously was feeling baby move normally. Has not performed kick counts. She denies VB. She denies headaches, vision changes, cp, sob, palpitations. No urinary symptoms. No fever/chills. No n/v/d/c. No respiratory symptoms. She is scheduled for rLTCS 07/14/20.   [facility]in media tab): all prenatal records and lab work reviewed.    OB History    Gravida  2   Para  1   Term  1   Preterm      AB      Living  1     SAB      TAB      Ectopic      Multiple  0   Live Births  1           Past Medical History:  Diagnosis Date  . Family history of adverse reaction to anesthesia    grandmother is hard to wake up  . History of bronchitis as a child   . History of kidney stones 10/2015  . Hypertension    "since preg has been higher"  . Migraines    takes Propranolol daily and Zanaflex as needed  . PONV (postoperative nausea and vomiting)    hard to wake up  . Pseudotumor cerebri   . SVT (supraventricular tachycardia) (HCC)     Past Surgical History:  Procedure Laterality Date  . CESAREAN SECTION N/A 06/10/2017   Procedure: CESAREAN SECTION;  Surgeon: Harold Hedgeomblin, James, MD;  [facility];  Service: Obstetrics;  Laterality: N/A;  . CHOLECYSTECTOMY N/A 12/20/2015   Procedure: LAPAROSCOPIC CHOLECYSTECTOMY;  Surgeon: Abigail Miyamotoouglas Blackman, MD;  Location: [facility] OR;  Service: General;  Laterality: N/A;  . GASTRIC BYPASS    . TONSILLECTOMY AND ADENOIDECTOMY     89 or 24 years old     Family History  Problem Relation Age of Onset  . Lung cancer Maternal Grandfather        Died at 6045  . Cancer Maternal Grandfather   . Hypertension Mother   . Diabetes Sister   . Heart disease Maternal Grandmother   . Diabetes Maternal Aunt   . Diabetes Maternal Uncle   . Cancer Maternal Uncle     Social History   Socioeconomic History  . Marital status: Married    Spouse name: Not on file  . Number of children: 2  . Years of education: Not on file  . Highest education level: Not on file  Occupational History  . Not on file  Tobacco Use  . Smoking status: Former Smoker    Types: Cigarettes  . Smokeless tobacco: Never Used  . Tobacco comment: Mom and step dad smoke outside, pt quit age 24  Substance and Sexual Activity  .  Alcohol use: No    Alcohol/week: 0.0 standard drinks  . Drug use: No  . Sexual activity: Yes  Other Topics Concern  . 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   She lives with her mother, sister, and step-father.    She enjoys walking in the park, swimming, hanging out with friends, and going to football games.    Social Determinants of Health   Financial Resource Strain:   . Difficulty of Paying Living Expenses: Not on file  Food Insecurity:   . Worried About Programme researcher, broadcasting/film/video in the Last Year: Not on file  . Ran Out of Food in the Last Year: Not on file  Transportation Needs:   . Lack of Transportation (Medical): Not on file  . Lack of Transportation (Non-Medical): Not on file  Physical Activity:   . Days of Exercise per Week: Not on file  . Minutes of Exercise per  Session: Not on file  Stress:   . Feeling of Stress : Not on file  Social Connections:   . Frequency of Communication with Friends and Family: Not on file  . Frequency of Social Gatherings with Friends and Family: Not on file  . Attends Religious Services: Not on file  . Active Member of Clubs or Organizations: Not on file  . Attends Banker Meetings: Not on file  . Marital Status: Not on file  Intimate Partner Violence:   . Fear of Current or Ex-Partner: Not on file  . Emotionally Abused: Not on file  . Physically Abused: Not on file  . Sexually Abused: Not on file    Allergies  Allergen Reactions  . Chlorhexidine Itching, Rash and Other (See Comments)    Burning     No current facility-administered medications on file prior to encounter.   Current Outpatient Medications on File Prior to Encounter  Medication Sig Dispense Refill  . Prenatal Vit-Fe Fumarate-FA (PRENATAL MULTIVITAMIN) TABS tablet Take 1 tablet by mouth daily at 12 noon.    Marland Kitchen acetaminophen (TYLENOL) 500 MG tablet Take 500-1,000 mg by mouth every 6 (six) hours as needed (for headaches or pain).    Marland Kitchen docusate sodium (COLACE) 100 MG capsule Take 1 capsule (100 mg total) by mouth every 12 (twelve) hours. 60 capsule 0  . doxylamine, Sleep, (UNISOM) 25 MG tablet Take 25 mg by mouth at bedtime as needed for sleep.    Marland Kitchen FLUoxetine (PROZAC) 10 MG capsule Take 10 mg by mouth daily.  0  . HYDROcodone-acetaminophen (NORCO/VICODIN) 5-325 MG tablet Take 1 tablet by mouth every 4 (four) hours as needed. 5 tablet 0  . hydrocortisone (ANUSOL-HC) 25 MG suppository Place 1 suppository (25 mg total) rectally 2 (two) times daily. For 7 days 14 suppository 0  . lisinopril (PRINIVIL,ZESTRIL) 5 MG tablet Take 5 mg by mouth daily.  0     ROS Pertinent positives and negative per HPI, all others reviewed and negative  Physical Exam   BP (!) 114/54   Pulse 89   Temp 98.3 F (36.8 C) (Oral)   Resp 18   Ht 5\' 5"  (1.651  m)   Wt 110.2 kg   SpO2 100%   BMI 40.42 kg/m   Physical Exam Vitals and nursing note reviewed. Exam conducted with a chaperone present.  Constitutional:      General: She is not in acute distress.    Appearance: Normal  appearance. She is normal weight.  HENT:     Head: Normocephalic and atraumatic.     Nose: Nose normal.     Mouth/Throat:     Mouth: Mucous membranes are moist.     Pharynx: Oropharynx is clear.  Eyes:     Extraocular Movements: Extraocular movements intact.     Conjunctiva/sclera: Conjunctivae normal.  Cardiovascular:     Rate and Rhythm: Normal rate.     Pulses: Normal pulses.  Pulmonary:     Effort: Pulmonary effort is normal.  Genitourinary:    Comments: Normal appearing external genitalia. Cervix normal appearing on exam. No cervical discharge noted. No pooling noted. 1/20/-1 on digital exam. Musculoskeletal:        General: Normal range of motion.     Cervical back: Normal range of motion and neck supple.  Skin:    General: Skin is warm and dry.  Neurological:     General: No focal deficit present.     Mental Status: She is alert and oriented to person, place, and time. Mental status is at baseline.  Psychiatric:        Mood and Affect: Mood normal.        Behavior: Behavior normal.     Cervical Exam Dilation: 1 Effacement (%): 20 Exam by:: Rania Prothero  Bedside Ultrasound Not indicated  My interpretation: n/a  FHT Baseline 145, mod variability, +accels, no decels Cat: 1  Labs Results for orders placed or performed during the hospital encounter of 05/25/20 (from the past 24 hour(s))  Urinalysis, Routine w reflex microscopic Urine, Clean Catch     Status: Abnormal   Collection Time: 05/25/20  4:02 PM  Result Value Ref Range   Color, Urine YELLOW YELLOW   APPearance HAZY (A) CLEAR   Specific Gravity, Urine 1.020 1.005 - 1.030   pH 6.0 5.0 - 8.0   Glucose, UA NEGATIVE NEGATIVE mg/dL   Hgb urine dipstick NEGATIVE NEGATIVE   Bilirubin  Urine NEGATIVE NEGATIVE   Ketones, ur NEGATIVE NEGATIVE mg/dL   Protein, ur NEGATIVE NEGATIVE mg/dL   Nitrite NEGATIVE NEGATIVE   Leukocytes,Ua TRACE (A) NEGATIVE   RBC / HPF 0-5 0 - 5 RBC/hpf   WBC, UA 11-20 0 - 5 WBC/hpf   Bacteria, UA MANY (A) NONE SEEN   Squamous Epithelial / LPF 0-5 0 - 5   Mucus PRESENT    Ca Oxalate Crys, UA PRESENT   Wet prep, genital     Status: Abnormal   Collection Time: 05/25/20  4:58 PM  Result Value Ref Range   Yeast Wet Prep HPF POC NONE SEEN NONE SEEN   Trich, Wet Prep NONE SEEN NONE SEEN   Clue Cells Wet Prep HPF POC NONE SEEN NONE SEEN   WBC, Wet Prep HPF POC MODERATE (A) NONE SEEN   Sperm NONE SEEN     Imaging No results found.  MAU Course  Procedures  Lab Orders     Wet prep, genital     Urinalysis, Routine w reflex microscopic     Protein / creatinine ratio, urine     Comprehensive metabolic panel     CBC No orders of the defined types were placed in this encounter.  Imaging Orders  No imaging studies ordered today    MDM moderate  Assessment and Plan  24yo G2P1001 at [redacted]w[redacted]d presents to MAU for evaluation of multiple complaints.  #DFM #False labor Patient presents with intermittent pelvic pressure, LOF, and decreased fetal movement. She has otherwise had  an uncomplicated pregnancy. BP initially elevated to 142/82, otherwise VSS. After re-sizing BP cuff, BP wnl. Exam overall unremarkable, including cervical exam. FHT reassuring, quiet toco. Wet prep unremarkable, gcc pending. PreE labs unremarkable. SSE negative for ferning and pooling. Patient advised she is not in labor, given strict return precautions and advised of kick counts. She should follow up with her OBGYN and proceed with plan for rLTCS on 07/14/20.  #FWB FHT Cat 1 NST: reactive  Alric Seton

## 2020-05-26 LAB — GC/CHLAMYDIA PROBE AMP (~~LOC~~) NOT AT ARMC
Chlamydia: NEGATIVE
Comment: NEGATIVE
Comment: NORMAL
Neisseria Gonorrhea: NEGATIVE

## 2020-06-19 ENCOUNTER — Other Ambulatory Visit: Payer: Self-pay

## 2020-06-19 ENCOUNTER — Encounter (HOSPITAL_COMMUNITY): Payer: Self-pay | Admitting: Obstetrics and Gynecology

## 2020-06-19 ENCOUNTER — Inpatient Hospital Stay (HOSPITAL_COMMUNITY)
Admission: AD | Admit: 2020-06-19 | Discharge: 2020-06-19 | Disposition: A | Discharge status: Home / Self Care | Payer: BC Managed Care – PPO | Attending: Obstetrics and Gynecology | Admitting: Obstetrics and Gynecology

## 2020-06-19 DIAGNOSIS — O99353 Diseases of the nervous system complicating pregnancy, third trimester: Secondary | ICD-10-CM | POA: Diagnosis not present

## 2020-06-19 DIAGNOSIS — O212 Late vomiting of pregnancy: Secondary | ICD-10-CM | POA: Insufficient documentation

## 2020-06-19 DIAGNOSIS — O211 Hyperemesis gravidarum with metabolic disturbance: Secondary | ICD-10-CM | POA: Diagnosis not present

## 2020-06-19 DIAGNOSIS — O139 Gestational [pregnancy-induced] hypertension without significant proteinuria, unspecified trimester: Secondary | ICD-10-CM

## 2020-06-19 DIAGNOSIS — Z87891 Personal history of nicotine dependence: Secondary | ICD-10-CM | POA: Insufficient documentation

## 2020-06-19 DIAGNOSIS — O133 Gestational [pregnancy-induced] hypertension without significant proteinuria, third trimester: Secondary | ICD-10-CM

## 2020-06-19 DIAGNOSIS — R112 Nausea with vomiting, unspecified: Secondary | ICD-10-CM

## 2020-06-19 DIAGNOSIS — E876 Hypokalemia: Secondary | ICD-10-CM | POA: Diagnosis not present

## 2020-06-19 DIAGNOSIS — Z9884 Bariatric surgery status: Secondary | ICD-10-CM | POA: Diagnosis not present

## 2020-06-19 DIAGNOSIS — Z3A35 35 weeks gestation of pregnancy: Secondary | ICD-10-CM | POA: Diagnosis not present

## 2020-06-19 DIAGNOSIS — G43009 Migraine without aura, not intractable, without status migrainosus: Secondary | ICD-10-CM | POA: Diagnosis not present

## 2020-06-19 LAB — COMPREHENSIVE METABOLIC PANEL
ALT: 20 U/L (ref 0–44)
AST: 20 U/L (ref 15–41)
Albumin: 2.4 g/dL — ABNORMAL LOW (ref 3.5–5.0)
Alkaline Phosphatase: 127 U/L — ABNORMAL HIGH (ref 38–126)
Anion gap: 9 (ref 5–15)
BUN: 5 mg/dL — ABNORMAL LOW (ref 6–20)
CO2: 20 mmol/L — ABNORMAL LOW (ref 22–32)
Calcium: 8.5 mg/dL — ABNORMAL LOW (ref 8.9–10.3)
Chloride: 110 mmol/L (ref 98–111)
Creatinine, Ser: 0.66 mg/dL (ref 0.44–1.00)
GFR, Estimated: >60 mL/min (ref >60)
Glucose, Bld: 117 mg/dL — ABNORMAL HIGH (ref 70–99)
Potassium: 3.1 mmol/L — ABNORMAL LOW (ref 3.5–5.1)
Sodium: 139 mmol/L (ref 135–145)
Total Bilirubin: 0.5 mg/dL (ref 0.3–1.2)
Total Protein: 5.8 g/dL — ABNORMAL LOW (ref 6.5–8.1)

## 2020-06-19 LAB — CBC
HCT: 33.5 % — ABNORMAL LOW (ref 36.0–46.0)
Hemoglobin: 10.7 g/dL — ABNORMAL LOW (ref 12.0–15.0)
MCH: 25.2 pg — ABNORMAL LOW (ref 26.0–34.0)
MCHC: 31.9 g/dL (ref 30.0–36.0)
MCV: 78.8 fL — ABNORMAL LOW (ref 80.0–100.0)
Platelets: 258 10*3/uL (ref 150–400)
RBC: 4.25 MIL/uL (ref 3.87–5.11)
RDW: 13.2 % (ref 11.5–15.5)
WBC: 7.4 10*3/uL (ref 4.0–10.5)
nRBC: 0 % (ref 0.0–0.2)

## 2020-06-19 LAB — PROTEIN / CREATININE RATIO, URINE
Creatinine, Urine: 267.88 mg/dL
Protein Creatinine Ratio: 0.2 mg/mg{Cre} — ABNORMAL HIGH (ref 0.00–0.15)
Total Protein, Urine: 54 mg/dL

## 2020-06-19 LAB — URINALYSIS, ROUTINE W REFLEX MICROSCOPIC
Glucose, UA: NEGATIVE mg/dL
Hgb urine dipstick: NEGATIVE
Ketones, ur: 40 mg/dL — AB
Leukocytes,Ua: NEGATIVE
Nitrite: NEGATIVE
Protein, ur: NEGATIVE mg/dL
Specific Gravity, Urine: 1.025 (ref 1.005–1.030)
pH: 6 (ref 5.0–8.0)

## 2020-06-19 MED ORDER — METOCLOPRAMIDE HCL 5 MG/ML IJ SOLN
10.0000 mg | Freq: Once | INTRAMUSCULAR | Status: AC
  Administered 2020-06-19: 10 mg via INTRAVENOUS
  Filled 2020-06-19: qty 2

## 2020-06-19 MED ORDER — DEXAMETHASONE SODIUM PHOSPHATE 10 MG/ML IJ SOLN
10.0000 mg | Freq: Once | INTRAMUSCULAR | Status: AC
  Administered 2020-06-19: 10 mg via INTRAVENOUS
  Filled 2020-06-19: qty 1

## 2020-06-19 MED ORDER — PROMETHAZINE HCL 25 MG PO TABS: 25.0000 mg | ORAL_TABLET | Freq: Four times a day (QID) | ORAL | 0 refills | Status: DC | PRN

## 2020-06-19 MED ORDER — POTASSIUM CHLORIDE ER 10 MEQ PO TBCR: 10.0000 meq | EXTENDED_RELEASE_TABLET | Freq: Every day | ORAL | 0 refills | Status: AC

## 2020-06-19 MED ORDER — LACTATED RINGERS IV BOLUS
1000.0000 mL | Freq: Once | INTRAVENOUS | Status: AC
  Administered 2020-06-19: 1000 mL via INTRAVENOUS

## 2020-06-19 MED ORDER — DIPHENHYDRAMINE HCL 50 MG/ML IJ SOLN
12.5000 mg | Freq: Once | INTRAMUSCULAR | Status: AC
  Administered 2020-06-19: 12.5 mg via INTRAVENOUS
  Filled 2020-06-19: qty 1

## 2020-06-19 NOTE — MAU Note (Signed)
Sent from MD office for BP evaluation.  Currently reports H/A & epigastric pain, denies visual disturbances.  Reports Hx of Pre Eclampsia with previous pregnancy.  Also reports lower back pain, that radiates int lower abdomen.  Hasn't taken meds to alleviate pain.  Denies VB or LOF.  Endorses +FM.

## 2020-06-19 NOTE — Discharge Instructions (Signed)

## 2020-06-19 NOTE — MAU Provider Note (Signed)
History     CSN: [id number]  Arrival date and time: 06/19/20 1655   First Provider Initiated Contact with Patient 06/19/20 1800      Chief Complaint  Patient presents with  . Back Pain  . BP Evaluation   HPI Melinda Riddle is a 24 y.o. female G2P1001 @ [redacted]w[redacted]d here from the office for a BP evaluation. She was seen in the office today for an [place] and her BP was elevated. She reports her BP has been normal; she did have 2 elevated BP readings in MAU at a prior visit. She reports no changes in vision. She reports recent HA's untreated with tylenol. Hx of migraines and pseudotumor cerebri; was taking medication prescribed by migraine Dr. Marikay Alar) which is not working.   + HA currently, left sided and radiates around to the back of her head. She has had this HA for days to weeks and it does not go away. It waxes and wanes.   + nausea and vomiting; has been a problem throughout pregnancy. Has been taking Zofran, which is an old RX that she had.   Hx of SVT following last delivery. Has a cardiologist; feels like she is intermittently going into SVT.   OB History    Gravida  2   Para  1   Term  1   Preterm      AB      Living  1     SAB      TAB      Ectopic      Multiple  0   Live Births  1           Past Medical History:  Diagnosis Date  . Family history of adverse reaction to anesthesia    grandmother is hard to wake up  . History of bronchitis as a child   . History of kidney stones 10/2015  . Hypertension    "since preg has been higher"  . Migraines    takes Propranolol daily and Zanaflex as needed  . PONV (postoperative nausea and vomiting)    hard to wake up  . Pseudotumor cerebri   . SVT (supraventricular tachycardia) (HCC)     Past Surgical History:  Procedure Laterality Date  . CESAREAN SECTION N/A 06/10/2017   Procedure: CESAREAN SECTION;  Surgeon: Harold Hedge, MD;  Location: [facility] BIRTHING SUITES;  Service: Obstetrics;  Laterality: N/A;   . CHOLECYSTECTOMY N/A 12/20/2015   Procedure: LAPAROSCOPIC CHOLECYSTECTOMY;  Surgeon: Abigail Miyamoto, MD;  Location: [facility] OR;  Service: General;  Laterality: N/A;  . GASTRIC BYPASS    . TONSILLECTOMY AND ADENOIDECTOMY     69 or 24 years old     Family History  Problem Relation Age of Onset  . Lung cancer Maternal Grandfather        Died at 90  . Cancer Maternal Grandfather   . Hypertension Mother   . Diabetes Sister   . Heart disease Maternal Grandmother   . Diabetes Maternal Aunt   . Diabetes Maternal Uncle   . Cancer Maternal Uncle     Social History   Tobacco Use  . Smoking status: Former Smoker    Types: Cigarettes  . Smokeless tobacco: Never Used  . Tobacco comment: Mom and step dad smoke outside, pt quit age 54  Substance Use Topics  . Alcohol use: No    Alcohol/week: 0.0 standard drinks  . Drug use: No    Allergies:  Allergies  Allergen Reactions  .  Chlorhexidine Itching, Rash and Other (See Comments)    Burning   . Tape Rash    Medications Prior to Admission  Medication Sig Dispense Refill Last Dose  . acetaminophen (TYLENOL) 500 MG tablet Take 500 mg by mouth every 6 (six) hours as needed.   06/19/2020 at Unknown time  . ondansetron (ZOFRAN-ODT) 4 MG disintegrating tablet Take 4 mg by mouth every 8 (eight) hours as needed for nausea or vomiting.   06/18/2020 at Unknown time  . Prenatal Vit-Fe Fumarate-FA (PRENATAL MULTIVITAMIN) TABS tablet Take 1 tablet by mouth daily at 12 noon.   06/19/2020 at Unknown time    Results for orders placed or performed during the hospital encounter of 06/19/20 (from the past 48 hour(s))  CBC     Status: Abnormal   Collection Time: 06/19/20  5:39 PM  Result Value Ref Range   WBC 7.4 4.0 - 10.5 K/uL   RBC 4.25 3.87 - 5.11 MIL/uL   Hemoglobin 10.7 (L) 12.0 - 15.0 g/dL   HCT 69.4 (L) 36 - 46 %   MCV 78.8 (L) 80.0 - 100.0 fL   MCH 25.2 (L) 26.0 - 34.0 pg   MCHC 31.9 30.0 - 36.0 g/dL   RDW 85.4 62.7 - 03.5 %   Platelets 258  150 - 400 K/uL   nRBC 0.0 0.0 - 0.2 %    Comment: Performed at [facility], [address], [place], [place] [address]  Comprehensive metabolic panel     Status: Abnormal   Collection Time: 06/19/20  5:39 PM  Result Value Ref Range   Sodium 139 135 - 145 mmol/L   Potassium 3.1 (L) 3.5 - 5.1 mmol/L   Chloride 110 98 - 111 mmol/L   CO2 20 (L) 22 - 32 mmol/L   Glucose, Bld 117 (H) 70 - 99 mg/dL    Comment: Glucose reference range applies only to samples taken after fasting for at least 8 hours.   BUN 5 (L) 6 - 20 mg/dL   Creatinine, Ser 1.82 0.44 - 1.00 mg/dL   Calcium 8.5 (L) 8.9 - 10.3 mg/dL   Total Protein 5.8 (L) 6.5 - 8.1 g/dL   Albumin 2.4 (L) 3.5 - 5.0 g/dL   AST 20 15 - 41 U/L   ALT 20 0 - 44 U/L   Alkaline Phosphatase 127 (H) 38 - 126 U/L   Total Bilirubin 0.5 0.3 - 1.2 mg/dL   GFR, Estimated >99 >37 mL/min   Anion gap 9 5 - 15    Comment: Performed at [facility], [address] [place], [place] [address]  Protein / creatinine ratio, urine     Status: Abnormal   Collection Time: 06/19/20  6:00 PM  Result Value Ref Range   Creatinine, Urine 267.88 mg/dL   Total Protein, Urine 54 mg/dL    Comment: NO NORMAL RANGE ESTABLISHED FOR THIS TEST   Protein Creatinine Ratio 0.20 (H) 0.00 - 0.15 mg/mg[Cre]    Comment: Performed at [facility], [address], [place], [place] [address]  Urinalysis, Routine w reflex microscopic     Status: Abnormal   Collection Time: 06/19/20  6:00 PM  Result Value Ref Range   Color, Urine YELLOW YELLOW   APPearance CLEAR CLEAR   Specific Gravity, Urine 1.025 1.005 - 1.030   pH 6.0 5.0 - 8.0   Glucose, UA NEGATIVE NEGATIVE mg/dL   Hgb urine dipstick NEGATIVE NEGATIVE   Bilirubin Urine SMALL (A) NEGATIVE  Ketones, ur 40 (A) NEGATIVE mg/dL   Protein, ur NEGATIVE NEGATIVE mg/dL   Nitrite NEGATIVE NEGATIVE   Leukocytes,Ua NEGATIVE NEGATIVE    Comment: Microscopic not done on urines with negative protein, blood, leukocytes,  nitrite, or glucose < 500 mg/dL. Performed at [facility], [address] [place], [place] [address]     Review of Systems  Constitutional: Negative for fever.  Eyes: Negative for photophobia and visual disturbance.  Respiratory: Negative for shortness of breath.   Cardiovascular: Negative for chest pain.  Gastrointestinal: Positive for abdominal pain (Generalized ).  Neurological: Positive for headaches.   Physical Exam   Blood pressure 120/88, pulse 98, temperature 98.5 F (36.9 C), temperature source Oral, resp. rate 20, height 5\' 5"  (1.651 m), weight 110.9 kg, SpO2 99 %, unknown if currently breastfeeding.   Patient Vitals for the past 24 hrs:  BP Temp Temp src Pulse Resp SpO2 Height Weight  06/19/20 1900 138/77 -- -- 83 -- -- -- --  06/19/20 1845 135/81 -- -- 81 -- 100 % -- --  06/19/20 1830 135/84 -- -- 78 -- -- -- --  06/19/20 1815 (!) 124/91 -- -- (!) 103 -- 97 % -- --  06/19/20 1800 120/88 -- -- 98 -- 99 % -- --  06/19/20 1750 (!) 149/86 -- -- 89 -- -- -- --  06/19/20 1721 (!) 141/79 98.5 F (36.9 C) Oral 80 20 100 % -- --  06/19/20 1715 -- -- -- -- -- -- 5\' 5"  (1.651 m) 110.9 kg    Physical Exam Constitutional:      General: She is not in acute distress.    Appearance: Normal appearance. She is not ill-appearing or toxic-appearing.  HENT:     Head: Normocephalic.  Eyes:     Pupils: Pupils are equal, round, and reactive to light.  Cardiovascular:     Rate and Rhythm: Normal rate and regular rhythm.     Heart sounds: Normal heart sounds.  Pulmonary:     Effort: Pulmonary effort is normal.     Breath sounds: Normal breath sounds.  Abdominal:     Palpations: Abdomen is soft.     Tenderness: There is no abdominal tenderness.  Musculoskeletal:        General: Normal range of motion.     Cervical back: Normal range of motion.  Skin:    General: Skin is warm.  Neurological:     Mental Status: She is alert and oriented to person, place, and time.      Deep Tendon Reflexes: Reflexes normal.  Psychiatric:        Mood and Affect: Mood normal.        Behavior: Behavior normal.    Fetal Tracing: Baseline: 145 bpm Variability: Moderate  Accelerations: 15x15 Decelerations: None Toco: UI  MAU Course  Procedures  None  MDM  EKG without acute findings; EKG read and reviewed by Dr. Adrian BlackwaterStinson.  New onset gestational HTN: discussed f/u plan, labs, and BP readings in MAU. Close f/u in the office tomorrow per Dr. Elon SpannerLeger.  Normal labs & Normal BP readings; HA unlikely 2/2 to PreE given patient's significant hx of migraines. HA cocktail and Lr bolus X 1, HA down to 1/10  Assessment and Plan   A:  1. Gestational hypertension, antepartum   2. Migraine without aura and without status migrainosus, not intractable   3. Nausea and vomiting, intractability of vomiting not specified, unspecified vomiting type   4. Hypokalemia   5. [redacted]  weeks gestation of pregnancy     P:  Discharge home with strict return precautions Pre Dr. Elon Spanner, patient to call the office in the AM to schedule BP check for AM Return to MAU if symptoms worsen Pre E precautions  Kashauna Celmer, Harolyn Rutherford, NP 06/21/2020 1:50 PM

## 2020-06-21 ENCOUNTER — Encounter (HOSPITAL_COMMUNITY): Payer: Self-pay

## 2020-06-21 NOTE — Patient Instructions (Signed)
Melinda Riddle  06/21/2020   Your procedure is scheduled on:  06/27/2020  Arrive at 1245 at Entrance C on [address] at [facility]  and [employer]. You are invited to use the FREE valet parking or use the Visitor's parking deck.  Pick up the phone at the desk and dial [phone].  Call this number if you have problems the morning of surgery: [phone]  Remember:   Do not eat food:(After Midnight) Desps de medianoche.  Do not drink clear liquids: (After Midnight) Desps de medianoche.  Take these medicines the morning of surgery with A SIP OF WATER:  none   Do not wear jewelry, make-up or nail polish.  Do not wear lotions, powders, or perfumes. Do not wear deodorant.  Do not shave 48 hours prior to surgery.  Do not bring valuables to the hospital.  [facility] is not   responsible for any belongings or valuables brought to the hospital.  Contacts, dentures or bridgework may not be worn into surgery.  Leave suitcase in the car. After surgery it may be brought to your room.  For patients admitted to the hospital, checkout time is 11:00 AM the day of              discharge.      Please read over the following fact sheets that you were given:     Preparing for Surgery

## 2020-06-25 ENCOUNTER — Encounter (HOSPITAL_COMMUNITY): Payer: Self-pay | Admitting: Obstetrics and Gynecology

## 2020-06-25 ENCOUNTER — Other Ambulatory Visit: Payer: Self-pay

## 2020-06-25 ENCOUNTER — Inpatient Hospital Stay (EMERGENCY_DEPARTMENT_HOSPITAL)
Admission: AD | Admit: 2020-06-25 | Discharge: 2020-06-26 | Disposition: A | Discharge status: Home / Self Care | Payer: BC Managed Care – PPO | Source: Home / Self Care | Attending: Obstetrics and Gynecology | Admitting: Obstetrics and Gynecology

## 2020-06-25 DIAGNOSIS — Z01812 Encounter for preprocedural laboratory examination: Secondary | ICD-10-CM | POA: Insufficient documentation

## 2020-06-25 DIAGNOSIS — Z3A36 36 weeks gestation of pregnancy: Secondary | ICD-10-CM | POA: Insufficient documentation

## 2020-06-25 DIAGNOSIS — Z888 Allergy status to other drugs, medicaments and biological substances status: Secondary | ICD-10-CM | POA: Insufficient documentation

## 2020-06-25 DIAGNOSIS — O133 Gestational [pregnancy-induced] hypertension without significant proteinuria, third trimester: Secondary | ICD-10-CM | POA: Insufficient documentation

## 2020-06-25 DIAGNOSIS — O4703 False labor before 37 completed weeks of gestation, third trimester: Secondary | ICD-10-CM | POA: Insufficient documentation

## 2020-06-25 DIAGNOSIS — O99843 Bariatric surgery status complicating pregnancy, third trimester: Secondary | ICD-10-CM | POA: Insufficient documentation

## 2020-06-25 DIAGNOSIS — Z9049 Acquired absence of other specified parts of digestive tract: Secondary | ICD-10-CM | POA: Insufficient documentation

## 2020-06-25 DIAGNOSIS — Z79899 Other long term (current) drug therapy: Secondary | ICD-10-CM | POA: Insufficient documentation

## 2020-06-25 DIAGNOSIS — Z87891 Personal history of nicotine dependence: Secondary | ICD-10-CM | POA: Insufficient documentation

## 2020-06-25 DIAGNOSIS — Z20822 Contact with and (suspected) exposure to covid-19: Secondary | ICD-10-CM | POA: Insufficient documentation

## 2020-06-25 DIAGNOSIS — O479 False labor, unspecified: Secondary | ICD-10-CM

## 2020-06-25 NOTE — MAU Note (Signed)
Pt reports contractions all day today, worsening tonight. Reports vaginal pain, reports some shortness of breath. History of SVT with last pregnancy (HR 71). Denies bleeding or ROM

## 2020-06-25 NOTE — MAU Provider Note (Signed)
History     CSN: [id number]  Arrival date and time: 06/25/20 2306   First Provider Initiated Contact with Patient 06/25/20 2337      Chief Complaint  Patient presents with  . Contractions   HPI Melinda Riddle is a 24 y.o. G2P1001 at [redacted]w[redacted]d who presents to MAU with chief complaint of contractions. This is a new problem, onset earlier today. Patient endorses significant physical activity today with prolonged walking as she and her mom shopped for the nursery. She endorses drinking water throughout the day. Patient rates her pain as 5/10, non-radiating. She denies aggravating or alleviating factors. She has not taken medication or tried other treatments for this complaint.  Patient's OB history is significant for Gestational Hypertension. She denies headache, visual disturbances, RUQ/epigastric pain, new onset swelling or weight gain.  Patient receives care with [facility]. She is scheduled for an elective repeat cesarean with BTL on 06/27/2020. Patient is inquiring about rescheduling her surgery for this evening. She has been NPO since 1930.   OB History    Gravida  2   Para  1   Term  1   Preterm      AB      Living  1     SAB      TAB      Ectopic      Multiple  0   Live Births  1           Past Medical History:  Diagnosis Date  . Depression    PPD took meds  . Family history of adverse reaction to anesthesia    grandmother is hard to wake up  . History of bronchitis as a child   . History of kidney stones 10/2015  . Hypertension    "since preg has been higher"  . Migraines    takes Propranolol daily and Zanaflex as needed  . PONV (postoperative nausea and vomiting)    hard to wake up  . Pseudotumor cerebri   . SVT (supraventricular tachycardia) (HCC)     Past Surgical History:  Procedure Laterality Date  . CESAREAN SECTION N/A 06/10/2017   Procedure: CESAREAN SECTION;  Surgeon: Harold Hedge, MD;  Location: [facility] BIRTHING SUITES;   Service: Obstetrics;  Laterality: N/A;  . CHOLECYSTECTOMY N/A 12/20/2015   Procedure: LAPAROSCOPIC CHOLECYSTECTOMY;  Surgeon: Abigail Miyamoto, MD;  Location: [facility] OR;  Service: General;  Laterality: N/A;  . GASTRIC BYPASS    . TONSILLECTOMY AND ADENOIDECTOMY     34 or 24 years old     Family History  Problem Relation Age of Onset  . Lung cancer Maternal Grandfather        Died at 12  . Cancer Maternal Grandfather   . Hypertension Mother   . Diabetes Sister   . Heart disease Maternal Grandmother   . Diabetes Maternal Aunt   . Diabetes Maternal Uncle   . Cancer Maternal Uncle     Social History   Tobacco Use  . Smoking status: Former Smoker    Types: Cigarettes  . Smokeless tobacco: Never Used  . Tobacco comment: Mom and step dad smoke outside, pt quit age 58  Vaping Use  . Vaping Use: Never used  Substance Use Topics  . Alcohol use: No    Alcohol/week: 0.0 standard drinks  . Drug use: No    Allergies:  Allergies  Allergen Reactions  . Chlorhexidine Itching, Rash and Other (See Comments)    Burning   .  Tape Rash    Medications Prior to Admission  Medication Sig Dispense Refill Last Dose  . acetaminophen (TYLENOL) 500 MG tablet Take 500 mg by mouth every 6 (six) hours as needed for headache.    Past Week at Unknown time  . ondansetron (ZOFRAN-ODT) 4 MG disintegrating tablet Take 4 mg by mouth every 8 (eight) hours as needed for nausea or vomiting.   Past Week at Unknown time  . Prenatal Vit-Fe Fumarate-FA (PRENATAL MULTIVITAMIN) TABS tablet Take 1 tablet by mouth daily at 12 noon.   06/24/2020 at Unknown time  . potassium chloride (KLOR-CON) 10 MEQ tablet Take 1 tablet (10 mEq total) by mouth daily. 7 tablet 0   . promethazine (PHENERGAN) 25 MG tablet Take 1 tablet (25 mg total) by mouth every 6 (six) hours as needed for nausea or vomiting. 30 tablet 0     Review of Systems  Gastrointestinal: Positive for abdominal pain.  Genitourinary: Positive for pelvic pain.  All  other systems reviewed and are negative.  Physical Exam   Blood pressure (!) 148/89, pulse 76, temperature 97.6 F (36.4 C), temperature source Oral, resp. rate 17, height 5\' 5"  (1.651 m), weight 113.4 kg, SpO2 100 %, unknown if currently breastfeeding.  Physical Exam Vitals and nursing note reviewed. Exam conducted with a chaperone present.  Constitutional:      Appearance: She is obese. She is not ill-appearing.  Cardiovascular:     Rate and Rhythm: Normal rate.     Pulses: Normal pulses.  Pulmonary:     Effort: Pulmonary effort is normal.  Abdominal:     Comments: Gravid  Neurological:     Mental Status: She is alert.     MAU Course  Procedures  --24 y.o. G2P1001 at [redacted]w[redacted]d  --Reactive tracing: baseline 125, mod var, + 15 x 15 accels, no decels --Toco: occasional ctx --Cervic remains 1/thick/long one hour after original exam --Patient declines additional hour of monitoring  Patient Vitals for the past 24 hrs:  BP Temp Temp src Pulse Resp SpO2 Height Weight  06/26/20 0046 134/89 -- -- 76 -- -- -- --  06/26/20 0031 140/79 -- -- 79 -- -- -- --  06/26/20 0018 139/82 -- -- 84 -- -- -- --  06/26/20 0001 133/82 -- -- 98 -- 99 % -- --  06/25/20 2350 -- -- -- -- -- 100 % -- --  06/25/20 2346 (!) 149/87 -- -- 74 -- -- -- --  06/25/20 2345 -- -- -- -- -- 100 % -- --  06/25/20 2335 -- -- -- -- -- 100 % -- --  06/25/20 2331 (!) 148/89 -- -- 76 -- -- -- --  06/25/20 2316 (!) 142/94 97.6 F (36.4 C) Oral 76 17 100 % 5\' 5"  (1.651 m) 113.4 kg   Results for orders placed or performed during the hospital encounter of 06/25/20 (from the past 24 hour(s))  Urinalysis, Routine w reflex microscopic Urine, Clean Catch     Status: Abnormal   Collection Time: 06/25/20 11:47 PM  Result Value Ref Range   Color, Urine YELLOW YELLOW   APPearance HAZY (A) CLEAR   Specific Gravity, Urine 1.016 1.005 - 1.030   pH 6.0 5.0 - 8.0   Glucose, UA NEGATIVE NEGATIVE mg/dL   Hgb urine dipstick NEGATIVE  NEGATIVE   Bilirubin Urine NEGATIVE NEGATIVE   Ketones, ur NEGATIVE NEGATIVE mg/dL   Protein, ur NEGATIVE NEGATIVE mg/dL   Nitrite NEGATIVE NEGATIVE   Leukocytes,Ua SMALL (A) NEGATIVE   RBC /  HPF 0-5 0 - 5 RBC/hpf   WBC, UA 6-10 0 - 5 WBC/hpf   Bacteria, UA RARE (A) NONE SEEN   Squamous Epithelial / LPF 6-10 0 - 5   Mucus PRESENT    Meds ordered this encounter  Medications  . zolpidem (AMBIEN) tablet 5 mg  . zolpidem (AMBIEN) 5 MG tablet    Sig: Take 1 tablet (5 mg total) by mouth at bedtime as needed for up to 1 dose for sleep.    Dispense:  1 tablet    Refill:  0    Order Specific Question:   Supervising Provider    Answer:   Duane Lope H [[id number]]    Assessment and Plan  --24 y.o. G2P1001 at [redacted]w[redacted]d  --GHTN, no severe symptoms or severe range pressures --Reactive tracing --Cervix remains 1/thick/long --Pt declines additional hour of monitoring --Ambien given in MAU, outpatient rx x 1 tablet  --Discharge home in stable condition  F/U: --Patient's cesarean is scheduled for tomorrow 06/27/2020 with Dr. Hortencia Pilar 06/25/2020, 2:46 AM

## 2020-06-26 ENCOUNTER — Other Ambulatory Visit (HOSPITAL_COMMUNITY)
Admission: RE | Admit: 2020-06-26 | Discharge: 2020-06-26 | Disposition: A | Discharge status: Home / Self Care | Payer: BC Managed Care – PPO | Source: Ambulatory Visit | Attending: Family Medicine | Admitting: Family Medicine

## 2020-06-26 ENCOUNTER — Other Ambulatory Visit (HOSPITAL_COMMUNITY)
Admission: RE | Admit: 2020-06-26 | Discharge: 2020-06-26 | Disposition: A | Discharge status: Home / Self Care | Payer: BC Managed Care – PPO | Source: Ambulatory Visit | Attending: Obstetrics & Gynecology | Admitting: Obstetrics & Gynecology

## 2020-06-26 DIAGNOSIS — Z01812 Encounter for preprocedural laboratory examination: Secondary | ICD-10-CM | POA: Insufficient documentation

## 2020-06-26 DIAGNOSIS — Z20822 Contact with and (suspected) exposure to covid-19: Secondary | ICD-10-CM | POA: Insufficient documentation

## 2020-06-26 DIAGNOSIS — Z3A36 36 weeks gestation of pregnancy: Secondary | ICD-10-CM | POA: Diagnosis not present

## 2020-06-26 DIAGNOSIS — O4703 False labor before 37 completed weeks of gestation, third trimester: Secondary | ICD-10-CM | POA: Diagnosis not present

## 2020-06-26 DIAGNOSIS — O133 Gestational [pregnancy-induced] hypertension without significant proteinuria, third trimester: Secondary | ICD-10-CM | POA: Diagnosis not present

## 2020-06-26 HISTORY — DX: Depression, unspecified: F32.A

## 2020-06-26 LAB — URINALYSIS, ROUTINE W REFLEX MICROSCOPIC
Bilirubin Urine: NEGATIVE
Glucose, UA: NEGATIVE mg/dL
Hgb urine dipstick: NEGATIVE
Ketones, ur: NEGATIVE mg/dL
Nitrite: NEGATIVE
Protein, ur: NEGATIVE mg/dL
Specific Gravity, Urine: 1.016 (ref 1.005–1.030)
pH: 6 (ref 5.0–8.0)

## 2020-06-26 LAB — SARS CORONAVIRUS 2 (TAT 6-24 HRS): SARS Coronavirus 2: NEGATIVE

## 2020-06-26 LAB — TYPE AND SCREEN
ABO/RH(D): A NEG
Antibody Screen: POSITIVE

## 2020-06-26 LAB — CBC
HCT: 32 % — ABNORMAL LOW (ref 36.0–46.0)
Hemoglobin: 10.2 g/dL — ABNORMAL LOW (ref 12.0–15.0)
MCH: 25.2 pg — ABNORMAL LOW (ref 26.0–34.0)
MCHC: 31.9 g/dL (ref 30.0–36.0)
MCV: 79 fL — ABNORMAL LOW (ref 80.0–100.0)
Platelets: 251 10*3/uL (ref 150–400)
RBC: 4.05 MIL/uL (ref 3.87–5.11)
RDW: 13.2 % (ref 11.5–15.5)
WBC: 7.7 10*3/uL (ref 4.0–10.5)
nRBC: 0 % (ref 0.0–0.2)

## 2020-06-26 LAB — HIV ANTIBODY (ROUTINE TESTING W REFLEX): HIV Screen 4th Generation wRfx: NONREACTIVE

## 2020-06-26 MED ORDER — ZOLPIDEM TARTRATE 5 MG PO TABS
5.0000 mg | ORAL_TABLET | Freq: Once | ORAL | Status: AC
  Administered 2020-06-26: 5 mg via ORAL
  Filled 2020-06-26: qty 1

## 2020-06-26 MED ORDER — ZOLPIDEM TARTRATE 5 MG PO TABS: 5.0000 mg | ORAL_TABLET | Freq: Every evening | ORAL | 0 refills | Status: AC | PRN

## 2020-06-26 MED ORDER — ZOLPIDEM TARTRATE 5 MG PO TABS: 5.0000 mg | ORAL_TABLET | Freq: Every evening | ORAL | 0 refills | Status: DC | PRN

## 2020-06-26 NOTE — Discharge Instructions (Signed)
Braxton Hicks Contractions °Contractions of the uterus can occur throughout pregnancy, but they are not always a sign that you are in labor. You may have practice contractions called Braxton Hicks contractions. These false labor contractions are sometimes confused with true labor. °What are Braxton Hicks contractions? °Braxton Hicks contractions are tightening movements that occur in the muscles of the uterus before labor. Unlike true labor contractions, these contractions do not result in opening (dilation) and thinning of the cervix. Toward the end of pregnancy (32-34 weeks), Braxton Hicks contractions can happen more often and may become stronger. These contractions are sometimes difficult to tell apart from true labor because they can be very uncomfortable. You should not feel embarrassed if you go to the hospital with false labor. °Sometimes, the only way to tell if you are in true labor is for your health care provider to look for changes in the cervix. The health care provider will do a physical exam and may monitor your contractions. If you are not in true labor, the exam should show that your cervix is not dilating and your water has not broken. °If there are no other health problems associated with your pregnancy, it is completely safe for you to be sent home with false labor. You may continue to have Braxton Hicks contractions until you go into true labor. °How to tell the difference between true labor and false labor °True labor °· Contractions last 30-70 seconds. °· Contractions become very regular. °· Discomfort is usually felt in the top of the uterus, and it spreads to the lower abdomen and low back. °· Contractions do not go away with walking. °· Contractions usually become more intense and increase in frequency. °· The cervix dilates and gets thinner. °False labor °· Contractions are usually shorter and not as strong as true labor contractions. °· Contractions are usually irregular. °· Contractions  are often felt in the front of the lower abdomen and in the groin. °· Contractions may go away when you walk around or change positions while lying down. °· Contractions get weaker and are shorter-lasting as time goes on. °· The cervix usually does not dilate or become thin. °Follow these instructions at home: ° °· Take over-the-counter and prescription medicines only as told by your health care provider. °· Keep up with your usual exercises and follow other instructions from your health care provider. °· Eat and drink lightly if you think you are going into labor. °· If Braxton Hicks contractions are making you uncomfortable: °? Change your position from lying down or resting to walking, or change from walking to resting. °? Sit and rest in a tub of warm water. °? Drink enough fluid to keep your urine pale yellow. Dehydration may cause these contractions. °? Do slow and deep breathing several times an hour. °· Keep all follow-up prenatal visits as told by your health care provider. This is important. °Contact a health care provider if: °· You have a fever. °· You have continuous pain in your abdomen. °Get help right away if: °· Your contractions become stronger, more regular, and closer together. °· You have fluid leaking or gushing from your vagina. °· You pass blood-tinged mucus (bloody show). °· You have bleeding from your vagina. °· You have low back pain that you never had before. °· You feel your baby’s head pushing down and causing pelvic pressure. °· Your baby is not moving inside you as much as it used to. °Summary °· Contractions that occur before labor are   called Braxton Hicks contractions, false labor, or practice contractions. °· Braxton Hicks contractions are usually shorter, weaker, farther apart, and less regular than true labor contractions. True labor contractions usually become progressively stronger and regular, and they become more frequent. °· Manage discomfort from Braxton Hicks contractions  by changing position, resting in a warm bath, drinking plenty of water, or practicing deep breathing. °This information is not intended to replace advice given to you by your health care provider. Make sure you discuss any questions you have with your health care provider. °Document Revised: 08/08/2017 Document Reviewed: 01/09/2017 °Elsevier Patient Education © 2020 [employer] ° °

## 2020-06-27 ENCOUNTER — Inpatient Hospital Stay (HOSPITAL_COMMUNITY): Payer: BC Managed Care – PPO | Admitting: Certified Registered Nurse Anesthetist

## 2020-06-27 ENCOUNTER — Encounter (HOSPITAL_COMMUNITY): Payer: Self-pay | Admitting: Obstetrics and Gynecology

## 2020-06-27 ENCOUNTER — Other Ambulatory Visit: Payer: Self-pay

## 2020-06-27 ENCOUNTER — Inpatient Hospital Stay (HOSPITAL_COMMUNITY)
Admission: RE | Admit: 2020-06-27 | Discharge: 2020-06-29 | DRG: 785 | Disposition: A | Discharge status: Home / Self Care | Payer: BC Managed Care – PPO | Attending: Obstetrics and Gynecology | Admitting: Obstetrics and Gynecology

## 2020-06-27 ENCOUNTER — Encounter (HOSPITAL_COMMUNITY)
Admission: RE | Disposition: A | Discharge status: Home / Self Care | Payer: Self-pay | Source: Home / Self Care | Attending: Obstetrics and Gynecology

## 2020-06-27 DIAGNOSIS — O99844 Bariatric surgery status complicating childbirth: Secondary | ICD-10-CM | POA: Diagnosis present

## 2020-06-27 DIAGNOSIS — Z6791 Unspecified blood type, Rh negative: Secondary | ICD-10-CM | POA: Diagnosis not present

## 2020-06-27 DIAGNOSIS — Z302 Encounter for sterilization: Secondary | ICD-10-CM

## 2020-06-27 DIAGNOSIS — O1414 Severe pre-eclampsia complicating childbirth: Secondary | ICD-10-CM | POA: Diagnosis present

## 2020-06-27 DIAGNOSIS — O34211 Maternal care for low transverse scar from previous cesarean delivery: Secondary | ICD-10-CM | POA: Diagnosis present

## 2020-06-27 DIAGNOSIS — O134 Gestational [pregnancy-induced] hypertension without significant proteinuria, complicating childbirth: Secondary | ICD-10-CM | POA: Diagnosis present

## 2020-06-27 DIAGNOSIS — O99214 Obesity complicating childbirth: Secondary | ICD-10-CM | POA: Diagnosis present

## 2020-06-27 DIAGNOSIS — O26893 Other specified pregnancy related conditions, third trimester: Secondary | ICD-10-CM | POA: Diagnosis present

## 2020-06-27 DIAGNOSIS — Z3A37 37 weeks gestation of pregnancy: Secondary | ICD-10-CM | POA: Diagnosis not present

## 2020-06-27 DIAGNOSIS — O99824 Streptococcus B carrier state complicating childbirth: Secondary | ICD-10-CM | POA: Diagnosis present

## 2020-06-27 DIAGNOSIS — Z87891 Personal history of nicotine dependence: Secondary | ICD-10-CM | POA: Diagnosis not present

## 2020-06-27 DIAGNOSIS — Z3490 Encounter for supervision of normal pregnancy, unspecified, unspecified trimester: Secondary | ICD-10-CM

## 2020-06-27 DIAGNOSIS — O321XX0 Maternal care for breech presentation, not applicable or unspecified: Secondary | ICD-10-CM | POA: Diagnosis present

## 2020-06-27 DIAGNOSIS — Z20822 Contact with and (suspected) exposure to covid-19: Secondary | ICD-10-CM | POA: Diagnosis present

## 2020-06-27 LAB — RPR: RPR Ser Ql: NONREACTIVE

## 2020-06-27 SURGERY — Surgical Case
Anesthesia: Spinal

## 2020-06-27 MED ORDER — SOD CITRATE-CITRIC ACID 500-334 MG/5ML PO SOLN
ORAL | Status: AC
  Filled 2020-06-27: qty 30

## 2020-06-27 MED ORDER — PRENATAL MULTIVITAMIN CH
1.0000 | ORAL_TABLET | Freq: Every day | ORAL | Status: DC
  Administered 2020-06-28 – 2020-06-29 (×2): 1 via ORAL
  Filled 2020-06-27 (×2): qty 1

## 2020-06-27 MED ORDER — FENTANYL CITRATE (PF) 100 MCG/2ML IJ SOLN
INTRAMUSCULAR | Status: DC | PRN
Start: 2020-06-27 — End: 2020-06-27
  Administered 2020-06-27: 15 ug via INTRATHECAL

## 2020-06-27 MED ORDER — PROMETHAZINE HCL 25 MG/ML IJ SOLN: 6.2500 mg | INTRAMUSCULAR | Status: DC | PRN

## 2020-06-27 MED ORDER — TETANUS-DIPHTH-ACELL PERTUSSIS 5-2.5-18.5 LF-MCG/0.5 IM SUSP: 0.5000 mL | Freq: Once | INTRAMUSCULAR | Status: DC

## 2020-06-27 MED ORDER — OXYTOCIN-SODIUM CHLORIDE 30-0.9 UT/500ML-% IV SOLN: 2.5000 [IU]/h | INTRAVENOUS | Status: AC

## 2020-06-27 MED ORDER — ZOLPIDEM TARTRATE 5 MG PO TABS: 5.0000 mg | ORAL_TABLET | Freq: Every evening | ORAL | Status: DC | PRN

## 2020-06-27 MED ORDER — WITCH HAZEL-GLYCERIN EX PADS: 1.0000 "application " | MEDICATED_PAD | CUTANEOUS | Status: DC | PRN

## 2020-06-27 MED ORDER — ONDANSETRON HCL 4 MG/2ML IJ SOLN
INTRAMUSCULAR | Status: DC | PRN
  Administered 2020-06-27: 4 mg via INTRAVENOUS

## 2020-06-27 MED ORDER — NALBUPHINE HCL 10 MG/ML IJ SOLN: 5.0000 mg | Freq: Once | INTRAMUSCULAR | Status: DC | PRN

## 2020-06-27 MED ORDER — MORPHINE SULFATE (PF) 0.5 MG/ML IJ SOLN
INTRAMUSCULAR | Status: DC | PRN
  Administered 2020-06-27: .15 mg via INTRATHECAL

## 2020-06-27 MED ORDER — DIPHENHYDRAMINE HCL 50 MG/ML IJ SOLN: 12.5000 mg | INTRAMUSCULAR | Status: DC | PRN

## 2020-06-27 MED ORDER — NALBUPHINE HCL 10 MG/ML IJ SOLN
5.0000 mg | INTRAMUSCULAR | Status: DC | PRN
  Administered 2020-06-28: 5 mg via SUBCUTANEOUS
  Filled 2020-06-27 (×2): qty 1

## 2020-06-27 MED ORDER — IBUPROFEN 800 MG PO TABS
800.0000 mg | ORAL_TABLET | Freq: Four times a day (QID) | ORAL | Status: DC
  Administered 2020-06-28 – 2020-06-29 (×4): 800 mg via ORAL
  Filled 2020-06-27 (×4): qty 1

## 2020-06-27 MED ORDER — SIMETHICONE 80 MG PO CHEW
80.0000 mg | CHEWABLE_TABLET | Freq: Three times a day (TID) | ORAL | Status: DC
  Administered 2020-06-28 – 2020-06-29 (×3): 80 mg via ORAL
  Filled 2020-06-27 (×3): qty 1

## 2020-06-27 MED ORDER — NALBUPHINE HCL 10 MG/ML IJ SOLN
5.0000 mg | INTRAMUSCULAR | Status: DC | PRN
  Administered 2020-06-27 – 2020-06-28 (×3): 5 mg via INTRAVENOUS
  Filled 2020-06-27 (×2): qty 1

## 2020-06-27 MED ORDER — SENNOSIDES-DOCUSATE SODIUM 8.6-50 MG PO TABS
2.0000 | ORAL_TABLET | ORAL | Status: DC
  Administered 2020-06-27 – 2020-06-29 (×2): 2 via ORAL
  Filled 2020-06-27 (×2): qty 2

## 2020-06-27 MED ORDER — SODIUM CHLORIDE 0.9% FLUSH: 3.0000 mL | INTRAVENOUS | Status: DC | PRN

## 2020-06-27 MED ORDER — MENTHOL 3 MG MT LOZG: 1.0000 | LOZENGE | OROMUCOSAL | Status: DC | PRN

## 2020-06-27 MED ORDER — OXYCODONE HCL 5 MG/5ML PO SOLN: 5.0000 mg | Freq: Once | ORAL | Status: DC | PRN

## 2020-06-27 MED ORDER — SCOPOLAMINE 1 MG/3DAYS TD PT72: 1.0000 | MEDICATED_PATCH | Freq: Once | TRANSDERMAL | Status: DC

## 2020-06-27 MED ORDER — MEASLES, MUMPS & RUBELLA VAC IJ SOLR: 0.5000 mL | Freq: Once | INTRAMUSCULAR | Status: DC

## 2020-06-27 MED ORDER — OXYTOCIN-SODIUM CHLORIDE 30-0.9 UT/500ML-% IV SOLN
INTRAVENOUS | Status: DC | PRN
  Administered 2020-06-27: 30 [IU] via INTRAVENOUS

## 2020-06-27 MED ORDER — LACTATED RINGERS IV SOLN: INTRAVENOUS | Status: DC

## 2020-06-27 MED ORDER — DIPHENHYDRAMINE HCL 25 MG PO CAPS
25.0000 mg | ORAL_CAPSULE | Freq: Four times a day (QID) | ORAL | Status: DC | PRN
  Administered 2020-06-28 (×2): 25 mg via ORAL
  Filled 2020-06-27: qty 1

## 2020-06-27 MED ORDER — OXYTOCIN-SODIUM CHLORIDE 30-0.9 UT/500ML-% IV SOLN
INTRAVENOUS | Status: AC
  Filled 2020-06-27: qty 500

## 2020-06-27 MED ORDER — CEFAZOLIN SODIUM-DEXTROSE 2-4 GM/100ML-% IV SOLN
2.0000 g | INTRAVENOUS | Status: AC
  Administered 2020-06-27: 2 g via INTRAVENOUS

## 2020-06-27 MED ORDER — PHENYLEPHRINE HCL-NACL 20-0.9 MG/250ML-% IV SOLN
INTRAVENOUS | Status: DC | PRN
  Administered 2020-06-27: 60 ug/min via INTRAVENOUS

## 2020-06-27 MED ORDER — ONDANSETRON HCL 4 MG/2ML IJ SOLN
INTRAMUSCULAR | Status: AC
  Filled 2020-06-27: qty 2

## 2020-06-27 MED ORDER — SCOPOLAMINE 1 MG/3DAYS TD PT72: 1.0000 | MEDICATED_PATCH | TRANSDERMAL | Status: DC

## 2020-06-27 MED ORDER — SIMETHICONE 80 MG PO CHEW
80.0000 mg | CHEWABLE_TABLET | ORAL | Status: DC
  Administered 2020-06-27 – 2020-06-29 (×2): 80 mg via ORAL
  Filled 2020-06-27 (×2): qty 1

## 2020-06-27 MED ORDER — KETOROLAC TROMETHAMINE 30 MG/ML IJ SOLN
30.0000 mg | Freq: Once | INTRAMUSCULAR | Status: AC | PRN
  Administered 2020-06-27: 30 mg via INTRAVENOUS

## 2020-06-27 MED ORDER — BUPIVACAINE IN DEXTROSE 0.75-8.25 % IT SOLN
INTRATHECAL | Status: DC | PRN
  Administered 2020-06-27: 1.6 mL via INTRATHECAL

## 2020-06-27 MED ORDER — PHENYLEPHRINE HCL-NACL 20-0.9 MG/250ML-% IV SOLN
INTRAVENOUS | Status: AC
  Filled 2020-06-27: qty 250

## 2020-06-27 MED ORDER — KETOROLAC TROMETHAMINE 30 MG/ML IJ SOLN
INTRAMUSCULAR | Status: AC
  Filled 2020-06-27: qty 1

## 2020-06-27 MED ORDER — NALOXONE HCL 0.4 MG/ML IJ SOLN: 0.4000 mg | INTRAMUSCULAR | Status: DC | PRN

## 2020-06-27 MED ORDER — COCONUT OIL OIL: 1.0000 "application " | TOPICAL_OIL | Status: DC | PRN

## 2020-06-27 MED ORDER — OXYCODONE HCL 5 MG PO TABS
5.0000 mg | ORAL_TABLET | ORAL | Status: DC | PRN
  Administered 2020-06-28: 10 mg via ORAL
  Administered 2020-06-28: 5 mg via ORAL
  Administered 2020-06-28 – 2020-06-29 (×3): 10 mg via ORAL
  Filled 2020-06-27 (×3): qty 2
  Filled 2020-06-27: qty 1
  Filled 2020-06-27: qty 2

## 2020-06-27 MED ORDER — DIPHENHYDRAMINE HCL 25 MG PO CAPS
25.0000 mg | ORAL_CAPSULE | ORAL | Status: DC | PRN
  Filled 2020-06-27: qty 1

## 2020-06-27 MED ORDER — SIMETHICONE 80 MG PO CHEW: 80.0000 mg | CHEWABLE_TABLET | ORAL | Status: DC | PRN

## 2020-06-27 MED ORDER — HYDROMORPHONE HCL 1 MG/ML IJ SOLN: 0.2000 mg | INTRAMUSCULAR | Status: DC | PRN

## 2020-06-27 MED ORDER — CEFAZOLIN SODIUM-DEXTROSE 2-4 GM/100ML-% IV SOLN
INTRAVENOUS | Status: AC
  Filled 2020-06-27: qty 100

## 2020-06-27 MED ORDER — OXYCODONE HCL 5 MG PO TABS: 5.0000 mg | ORAL_TABLET | Freq: Once | ORAL | Status: DC | PRN

## 2020-06-27 MED ORDER — DEXAMETHASONE SODIUM PHOSPHATE 10 MG/ML IJ SOLN
INTRAMUSCULAR | Status: DC | PRN
  Administered 2020-06-27: 4 mg via INTRAVENOUS

## 2020-06-27 MED ORDER — SODIUM CHLORIDE 0.9 % IV SOLN: INTRAVENOUS | Status: DC | PRN

## 2020-06-27 MED ORDER — NALOXONE HCL 4 MG/10ML IJ SOLN
1.0000 ug/kg/h | INTRAVENOUS | Status: DC | PRN
  Filled 2020-06-27: qty 5

## 2020-06-27 MED ORDER — ONDANSETRON HCL 4 MG/2ML IJ SOLN: 4.0000 mg | Freq: Three times a day (TID) | INTRAMUSCULAR | Status: DC | PRN

## 2020-06-27 MED ORDER — MEPERIDINE HCL 25 MG/ML IJ SOLN: 6.2500 mg | INTRAMUSCULAR | Status: DC | PRN

## 2020-06-27 MED ORDER — ACETAMINOPHEN 500 MG PO TABS
1000.0000 mg | ORAL_TABLET | Freq: Four times a day (QID) | ORAL | Status: DC
  Administered 2020-06-27 – 2020-06-29 (×6): 1000 mg via ORAL
  Filled 2020-06-27 (×7): qty 2

## 2020-06-27 MED ORDER — SODIUM CHLORIDE 0.9 % IR SOLN
Status: DC | PRN
  Administered 2020-06-27: 1000 mL

## 2020-06-27 MED ORDER — SOD CITRATE-CITRIC ACID 500-334 MG/5ML PO SOLN
30.0000 mL | ORAL | Status: AC
  Administered 2020-06-27: 30 mL via ORAL

## 2020-06-27 MED ORDER — SCOPOLAMINE 1 MG/3DAYS TD PT72
MEDICATED_PATCH | TRANSDERMAL | Status: AC
  Filled 2020-06-27: qty 1

## 2020-06-27 MED ORDER — FENTANYL CITRATE (PF) 100 MCG/2ML IJ SOLN
INTRAMUSCULAR | Status: DC | PRN
Start: 2020-06-27 — End: 2020-06-27
  Administered 2020-06-27: 50 ug via INTRAVENOUS
  Administered 2020-06-27: 35 ug via INTRAVENOUS

## 2020-06-27 MED ORDER — DIBUCAINE (PERIANAL) 1 % EX OINT: 1.0000 "application " | TOPICAL_OINTMENT | CUTANEOUS | Status: DC | PRN

## 2020-06-27 MED ORDER — FENTANYL CITRATE (PF) 100 MCG/2ML IJ SOLN
INTRAMUSCULAR | Status: AC
  Filled 2020-06-27: qty 2

## 2020-06-27 MED ORDER — HYDROMORPHONE HCL 1 MG/ML IJ SOLN: 0.2500 mg | INTRAMUSCULAR | Status: DC | PRN

## 2020-06-27 MED ORDER — MORPHINE SULFATE (PF) 0.5 MG/ML IJ SOLN
INTRAMUSCULAR | Status: AC
  Filled 2020-06-27: qty 10

## 2020-06-27 MED ORDER — KETOROLAC TROMETHAMINE 30 MG/ML IJ SOLN
30.0000 mg | Freq: Four times a day (QID) | INTRAMUSCULAR | Status: AC
  Administered 2020-06-27 – 2020-06-28 (×3): 30 mg via INTRAVENOUS
  Filled 2020-06-27 (×3): qty 1

## 2020-06-27 MED ORDER — DEXAMETHASONE SODIUM PHOSPHATE 10 MG/ML IJ SOLN
INTRAMUSCULAR | Status: AC
  Filled 2020-06-27: qty 1

## 2020-06-27 SURGICAL SUPPLY — 42 items
ADH SKN CLS APL DERMABOND .7 (GAUZE/BANDAGES/DRESSINGS) ×1
APL SKNCLS STERI-STRIP NONHPOA (GAUZE/BANDAGES/DRESSINGS) ×1
BENZOIN TINCTURE PRP APPL 2/3 (GAUZE/BANDAGES/DRESSINGS) ×2 IMPLANT
CHLORAPREP W/TINT 26ML (MISCELLANEOUS) ×3 IMPLANT
CLAMP CORD UMBIL (MISCELLANEOUS) IMPLANT
CLOSURE WOUND 1/2 X4 (GAUZE/BANDAGES/DRESSINGS) ×1
CLOTH BEACON ORANGE TIMEOUT ST (SAFETY) ×3 IMPLANT
DERMABOND ADVANCED (GAUZE/BANDAGES/DRESSINGS) ×2
DERMABOND ADVANCED .7 DNX12 (GAUZE/BANDAGES/DRESSINGS) ×1 IMPLANT
DRESSING PREVENA PLUS CUSTOM (GAUZE/BANDAGES/DRESSINGS) IMPLANT
DRSG PREVENA PLUS CUSTOM (GAUZE/BANDAGES/DRESSINGS) ×3
ELECT REM PT RETURN 9FT ADLT (ELECTROSURGICAL) ×3
ELECTRODE REM PT RTRN 9FT ADLT (ELECTROSURGICAL) ×1 IMPLANT
EXTRACTOR VACUUM KIWI (MISCELLANEOUS) IMPLANT
GLOVE BIO SURGEON STRL SZ 6.5 (GLOVE) ×2 IMPLANT
GLOVE BIO SURGEONS STRL SZ 6.5 (GLOVE) ×1
GLOVE BIOGEL PI IND STRL 6.5 (GLOVE) ×1 IMPLANT
GLOVE BIOGEL PI IND STRL 7.0 (GLOVE) ×2 IMPLANT
GLOVE BIOGEL PI INDICATOR 6.5 (GLOVE) ×2
GLOVE BIOGEL PI INDICATOR 7.0 (GLOVE) ×4
GOWN STRL REUS W/TWL LRG LVL3 (GOWN DISPOSABLE) ×6 IMPLANT
KIT ABG SYR 3ML LUER SLIP (SYRINGE) ×3 IMPLANT
MAT PREVALON FULL STRYKER (MISCELLANEOUS) ×2 IMPLANT
NDL HYPO 25X5/8 SAFETYGLIDE (NEEDLE) ×1 IMPLANT
NEEDLE HYPO 25X5/8 SAFETYGLIDE (NEEDLE) ×3 IMPLANT
NS IRRIG 1000ML POUR BTL (IV SOLUTION) ×3 IMPLANT
PACK C SECTION WH (CUSTOM PROCEDURE TRAY) ×3 IMPLANT
PAD OB MATERNITY 4.3X12.25 (PERSONAL CARE ITEMS) ×3 IMPLANT
PENCIL SMOKE EVAC W/HOLSTER (ELECTROSURGICAL) ×3 IMPLANT
RETRACTOR TRAXI PANNICULUS (MISCELLANEOUS) IMPLANT
STRIP CLOSURE SKIN 1/2X4 (GAUZE/BANDAGES/DRESSINGS) ×1 IMPLANT
SUT PLAIN 0 NONE (SUTURE) IMPLANT
SUT PLAIN 2 0 (SUTURE) ×3
SUT PLAIN ABS 2-0 CT1 27XMFL (SUTURE) ×1 IMPLANT
SUT VIC AB 0 CT1 36 (SUTURE) ×3 IMPLANT
SUT VIC AB 0 CTX 36 (SUTURE) ×6
SUT VIC AB 0 CTX36XBRD ANBCTRL (SUTURE) ×2 IMPLANT
SUT VIC AB 4-0 PS2 27 (SUTURE) ×3 IMPLANT
TOWEL OR 17X24 6PK STRL BLUE (TOWEL DISPOSABLE) ×3 IMPLANT
TRAXI PANNICULUS RETRACTOR (MISCELLANEOUS) ×2
TRAY FOLEY W/BAG SLVR 14FR LF (SET/KITS/TRAYS/PACK) IMPLANT
WATER STERILE IRR 1000ML POUR (IV SOLUTION) ×3 IMPLANT

## 2020-06-27 NOTE — Lactation Note (Signed)
This note was copied from a baby's chart. Lactation Consultation Note  Patient Name: Melinda Riddle [id number] Date: 06/27/2020 Reason for consult: Initial assessment;Early term 37-38.6wks P2, 5 hour ETI female infant. Infant had one void diaper since delivery. Per mom, she has Medela DEBP at home. Infant was cuing to BF when LC walked in the room, this is infant's third time BF, infant BF 1st time-5 minutes, 2nd time-15 minutes. Per mom, she feels BF is going very well with her 2nd child,  unlike her daughter who is now 70 years old, she stopped BF her daughter only after 2 weeks pp due latch difficulties.   Mom latched infant on her left breast using the football hold position, infant latched with depth, swallows observed and infant breastfed for 13 minutes. LC discussed hand expression and mom taught back hand expression and  easily expressing 4 mls that was spoon feed to infant. Mom knows to BF infant according to cues, 8 to 12+ times within 24 hours, STS. Mom asked LC regarding using DEBP, she wants to pump in hospital LC set up DEBP and mom understands to pump every 3 hours for 15 minutes on initial setting. Mom plans to hand express and use DEBP and give infant back extra volume after latching infant at the breast, this is her choice. Mom shown how to use DEBP & how to disassemble, clean, & reassemble parts. Mom knows to call RN or LC if she has any further questions, concerns or needs assistance with latching infant at the breast. LC encouraged mom to attend [place] BF support group ( which is free) to help support BF endeavors  after hospital discharge. Mom made aware of O/P services, breastfeeding support groups, community resources, and our phone # for post-discharge questions.   Maternal Data Formula Feeding for Exclusion: Yes Reason for exclusion: Mother's choice to formula and breast feed on admission Has patient been taught Hand Expression?: Yes Does the patient have  breastfeeding experience prior to this delivery?: Yes  Feeding Feeding Type: Breast Fed  LATCH Score Latch: Grasps breast easily, tongue down, lips flanged, rhythmical sucking.  Audible Swallowing: Spontaneous and intermittent  Type of Nipple: Everted at rest and after stimulation  Comfort (Breast/Nipple): Soft / non-tender  Hold (Positioning): Assistance needed to correctly position infant at breast and maintain latch.  LATCH Score: 9  Interventions Interventions: Breast feeding basics reviewed;Breast compression;Assisted with latch;Adjust position;Skin to skin;Support pillows;Breast massage;Position options;Hand express;Expressed milk;DEBP  Lactation Tools Discussed/Used Tools: Pump Breast pump type: Double-Electric Breast Pump WIC Program: No Pump Review: Setup, frequency, and cleaning;Milk Storage Initiated by:: Melinda Riddle, IBCLC Date initiated:: 06/27/20   Consult Status Consult Status: Follow-up Date: 06/28/20 Follow-up type: In-patient    Melinda Riddle 06/27/2020, 7:58 PM

## 2020-06-27 NOTE — Anesthesia Postprocedure Evaluation (Signed)
Anesthesia Post Note  Patient: Melinda Riddle  Procedure(s) Performed: REPEAT CESAREAN SECTION WITH BILATERAL TUBAL LIGATION (N/A )     Patient location during evaluation: PACU Anesthesia Type: Spinal Level of consciousness: awake and alert Pain management: pain level controlled Vital Signs Assessment: post-procedure vital signs reviewed and stable Respiratory status: spontaneous breathing, nonlabored ventilation and respiratory function stable Cardiovascular status: blood pressure returned to baseline and stable Postop Assessment: no apparent nausea or vomiting Anesthetic complications: no   No complications documented.  Last Vitals:  Vitals:   06/27/20 1634 06/27/20 1644  BP:  (!) 141/92  Pulse: (!) 57 (!) 51  Resp: 18 16  Temp: 36.6 C 36.7 C  SpO2: 100% 100%    Last Pain:  Vitals:   06/27/20 1644  TempSrc: Oral   Pain Goal:                Epidural/Spinal Function Cutaneous sensation: Able to Wiggle Toes (06/27/20 1634), Patient able to flex knees: Yes (06/27/20 1634), Patient able to lift hips off bed: No (06/27/20 1634), Back pain beyond tenderness at insertion site: No (06/27/20 1634), Progressively worsening motor and/or sensory loss: No (06/27/20 1634), Bowel and/or bladder incontinence post epidural: No (06/27/20 1634)  Lowella Curb

## 2020-06-27 NOTE — Progress Notes (Signed)
Called "Melinda Riddle" first call labor and delivery to report elevated BPs and the need for some depression meds for hx PPD.

## 2020-06-27 NOTE — H&P (Signed)
Melinda Riddle is a 24 y.o. female presenting for scheduled repeat cesarean section for diagnosis of pregnancy induced hypertension without severe features.  Pregnancy complicated by: 1.) h/o PP cardiomyopathy  2.) PIH (and hx of in prior pregnancy with severe features). No current severe features. Recent labs w/out severe disease. Hgb was 10.7 at that time.    3.)  prior C section: elective repeat with BTL.  4.) history of gastric bypass  5.) history of pseudotumor cerebri (dx 2015)  6.) RH negative (s/p rhogam)  7.) Rubella non-immune - MMR planned pp  8.)  Morbid obesity.   9.) Hx of SVT: Last pregnancy on POD#1 she c/o tachycardia and found to be in SVT. Cardiology was consulted and adenosine was not needed as she spontaneously returned to normal rhythm. ECHO demonstrated cardiomyopathy with ED 45%. She was discharged with metoprolol, lasix and KCL. This pregnancy she has not had any cardiac issues and cardiology has been following her. They performed an EKG (NSR).    OB History    Gravida  2   Para  1   Term  1   Preterm      AB      Living  1     SAB      TAB      Ectopic      Multiple  0   Live Births  1          Past Medical History:  Diagnosis Date  . Depression    PPD took meds  . Family history of adverse reaction to anesthesia    grandmother is hard to wake up  . History of bronchitis as a child   . History of kidney stones 10/2015  . Hypertension    "since preg has been higher"  . Migraines    takes Propranolol daily and Zanaflex as needed  . PONV (postoperative nausea and vomiting)    hard to wake up  . Pseudotumor cerebri   . SVT (supraventricular tachycardia) (HCC)    Past Surgical History:  Procedure Laterality Date  . CESAREAN SECTION N/A 06/10/2017   Procedure: CESAREAN SECTION;  Surgeon: Everlene Farrier, MD;  Location: [facility];  Service: Obstetrics;  Laterality: N/A;  . CHOLECYSTECTOMY N/A 12/20/2015   Procedure:  LAPAROSCOPIC CHOLECYSTECTOMY;  Surgeon: Coralie Keens, MD;  Location: [facility];  Service: General;  Laterality: N/A;  . GASTRIC BYPASS    . TONSILLECTOMY AND ADENOIDECTOMY     58 or 24 years old    Family History: family history includes Cancer in her maternal grandfather and maternal uncle; Diabetes in her maternal aunt, maternal uncle, and sister; Heart disease in her maternal grandmother; Hypertension in her mother; Lung cancer in her maternal grandfather. Social History:  reports that she has quit smoking. Her smoking use included cigarettes. She has never used smokeless tobacco. She reports that she does not drink alcohol and does not use drugs.     Maternal Diabetes: No Genetic Screening: Normal - panorama rr female Maternal Ultrasounds/Referrals: Normal Fetal Ultrasounds or other Referrals:  Other: Cardiology Maternal Substance Abuse:  No Significant Maternal Medications:  None Significant Maternal Lab Results:  Group B Strep positive Other Comments:  None  Review of Systems History   unknown if currently breastfeeding. Exam Physical Exam  Prenatal labs: ABO, Rh: --/--/A NEG (10/18 1312) Antibody: POS (10/18 1312) Rubella:   RPR:    HBsAg:    HIV: Non Reactive (10/18 1257)  GBS:   POSITIVE  Assessment/Plan:  24 yo G2P1001 presenting for scheduled repeat CS with requested bilateral tubal ligation for a history of pregnancy induced HTN. She is medically complicated by the above issues and has had complications with both pregnancy. She strongly desires a BTL despite her young age. Risks discussed including infection, bleeding, damage to surrounding structures, the need for additional procedures including hysterectomy, and the possibility of uterine rupture with neonatal morbidity/mortality, scarring, and abnormal placentation with subsequent pregnancies. We also discussed bilateral tubal ligation in detail. The alternatives to permanent sterilization were reviewed and it was  discussed that this is considered permanent. We reviewed the risks in detail including regret, failure and ectopic pregnancy. She understands and agrees to proceed. 2g ancef on call to OR. Plan for Rhogam and MMR pp. Will place wound vac at time of surgery.    Tyson Dense 06/27/2020, 10:11 AM

## 2020-06-27 NOTE — Op Note (Signed)
PROCEDURE DATE: 06/27/20   PREOPERATIVE DIAGNOSIS: Intrauterine pregnancy at  37.0 wga, Indication: pregnancy induced hypertension, desires repeat, and desires permanent sterility.    POSTOPERATIVE DIAGNOSIS: The same and breech presentation   PROCEDURE:    Repeat Low Transverse Cesarean Section with BTL   SURGEON:  Dr. Belva Agee   INDICATIONS: This is a 24yo G2P1 at 37.0 wga requiring cesarean section secondary to desires repeat and breech presentation in the setting of PIH and desires permanent sterility. The risks of cesarean section discussed with the patient included but were not limited to: bleeding which may require transfusion or reoperation; infection which may require antibiotics; injury to bowel, bladder, ureters or other surrounding organs; injury to the fetus; need for additional procedures including hysterectomy in the event of a life-threatening hemorrhage; placental abnormalities wth subsequent pregnancies, incisional problems, thromboembolic phenomenon and other postoperative/anesthesia complications. We also discussed bilateral tubal ligation in detail. The alternatives to permanent sterilization were reviewed and it was discussed that this is considered permanent. We reviewed the risks in detail including regret, failure and ectopic pregnancy.   The patient agreed with the proposed plan, giving informed consent for the procedure.     FINDINGS:  Viable female infant in frank breech presentation, APGARs pending,  Weight pending, Amniotic fluid clear,  Intact placenta, three vessel cord.  Grossly normal uterus, ovaries and fallopian tubes. .   ANESTHESIA:    Epidural ESTIMATED BLOOD LOSS: 161cc SPECIMENS: Placenta for routein COMPLICATIONS: None immediate   PROCEDURE IN DETAIL:     The patient received intravenous antibiotics (2g Ancef) and had sequential compression devices applied to her lower extremities while in the preoperative area.  She was then taken to the operating  room where epidural anesthesia was dosed up to surgical level and was found to be adequate. She was then placed in a dorsal supine position with a leftward tilt, and prepped and draped in a sterile manner.  A foley catheter was placed into her bladder and attached to constant gravity.  After an adequate timeout was performed, a Pfannenstiel skin incision was made with scalpel and carried through to the underlying layer of fascia. The fascia was incised in the midline and this incision was extended bilaterally using the Mayo scissors. Kocher clamps were applied to the superior aspect of the fascial incision and the underlying rectus muscles were dissected off bluntly. A similar process was carried out on the inferior aspect of the facial incision. The rectus muscles were separated in the midline bluntly and the peritoneum was entered bluntly.  A bladder flap was created sharply and developed bluntly. A transverse hysterotomy was made with a scalpel and extended bilaterally bluntly. The bladder blade was then removed. The infant was successfully delivered with the typical breech maneuvers, and cord was clamped and cut and infant was handed over to awaiting neonatology team. Uterine massage was then administered and the placenta delivered intact with three-vessel cord. Cord gases were taken. The uterus was cleared of clot and debris.  The hysterotomy was closed with 0 vicryl.  A second imbricating suture of 0-vicryl was used to reinforce the incision and aid in hemostasis. A bilateral tubal ligation was performed via modified pomeroy in the usual fashion.  Good hemostasis was noted before and after uterus and fallopian tubes placed back into abdomen. The fascia was closed with 0-Vicryl in a running fashion with good restoration of anatomy.  The subcutaneus tissue was irrigated and was reapproximated using three interrupted plain gut stitches.  The  skin was closed with 4-0 Vicryl in a subcuticular fashion.  A wound  vac was placed given patient's body habitus.   Final EBL was 161cc (all surgical site and was hemostatic at end of procedure) without any further bleeding on exam.    Pt tolerated the procedure well. All sponge/lap/needle counts were correct  X 2. Pt taken to recovery room in stable condition.     Belva Agee MD

## 2020-06-27 NOTE — Anesthesia Procedure Notes (Signed)
Spinal  Patient location during procedure: OB End time: 06/27/2020 2:00 PM Staffing Performed: anesthesiologist  Anesthesiologist: Lowella Curb, MD Preanesthetic Checklist Completed: patient identified, IV checked, risks and benefits discussed, surgical consent, monitors and equipment checked, pre-op evaluation and timeout performed Spinal Block Patient position: sitting Prep: DuraPrep and site prepped and draped Patient monitoring: heart rate, cardiac monitor, continuous pulse ox and blood pressure Approach: midline Location: L3-4 Injection technique: single-shot Needle Needle type: Pencan  Needle gauge: 24 G Needle length: 10 cm Assessment Sensory level: T4 Additional Notes SAB attempted by SRNA.  Anesthesiologist attempt successful at higher level.

## 2020-06-27 NOTE — Transfer of Care (Signed)
Immediate Anesthesia Transfer of Care Note  Patient: Melinda Riddle  Procedure(s) Performed: REPEAT CESAREAN SECTION WITH BILATERAL TUBAL LIGATION (N/A )  Patient Location: PACU  Anesthesia Type:Spinal  Level of Consciousness: awake, alert  and oriented  Airway & Oxygen Therapy: Patient Spontanous Breathing  Post-op Assessment: Report given to RN and Post -op Vital signs reviewed and stable  Post vital signs: Reviewed and stable  Last Vitals:  Vitals Value Taken Time  BP 129/72 06/27/20 1517  Temp    Pulse 69 06/27/20 1520  Resp 13 06/27/20 1520  SpO2 100 % 06/27/20 1520  Vitals shown include unvalidated device data.  Last Pain:  Vitals:   06/27/20 1517  TempSrc: (P) Oral         Complications: No complications documented.

## 2020-06-27 NOTE — Anesthesia Preprocedure Evaluation (Addendum)
Anesthesia Evaluation    History of Anesthesia Complications (+) PONV  Airway Mallampati: II  TM Distance: >3 FB Neck ROM: Full    Dental no notable dental hx.    Pulmonary former smoker,    Pulmonary exam normal breath sounds clear to auscultation       Cardiovascular hypertension, Normal cardiovascular exam Rhythm:Regular Rate:Normal     Neuro/Psych  Headaches, Depression    GI/Hepatic   Endo/Other    Renal/GU      Musculoskeletal   Abdominal (+) + obese,   Peds  Hematology   Anesthesia Other Findings   Reproductive/Obstetrics (+) Pregnancy                                                             Anesthesia Evaluation  Patient identified by MRN, date of birth, ID band Patient awake    Reviewed: Allergy & Precautions, NPO status , Patient's Chart, lab work & pertinent test results  History of Anesthesia Complications (+) PONV  Airway Mallampati: II  TM Distance: >3 FB Neck ROM: Full    Dental no notable dental hx.    Pulmonary former smoker,    Pulmonary exam normal breath sounds clear to auscultation       Cardiovascular hypertension, Normal cardiovascular exam Rhythm:Regular Rate:Normal     Neuro/Psych Pseudotumor cerebri negative neurological ROS  negative psych ROS   GI/Hepatic negative GI ROS, Neg liver ROS,   Endo/Other  Morbid obesity  Renal/GU negative Renal ROS  negative genitourinary   Musculoskeletal negative musculoskeletal ROS (+)   Abdominal   Peds negative pediatric ROS (+)  Hematology negative hematology ROS (+)   Anesthesia Other Findings   Reproductive/Obstetrics (+) Pregnancy                            Anesthesia Physical Anesthesia Plan  ASA: III  Anesthesia Plan: Spinal   Post-op Pain Management:    Induction:   PONV Risk Score and Plan: 3 and Ondansetron and Treatment may vary due to  age or medical condition  Airway Management Planned: Natural Airway  Additional Equipment:   Intra-op Plan:   Post-operative Plan:   Informed Consent: I have reviewed the patients History and Physical, chart, labs and discussed the procedure including the risks, benefits and alternatives for the proposed anesthesia with the patient or authorized representative who has indicated his/her understanding and acceptance.   Dental advisory given  Plan Discussed with:   Anesthesia Plan Comments:         Anesthesia Quick Evaluation  Anesthesia Physical Anesthesia Plan  ASA: III  Anesthesia Plan: Spinal   Post-op Pain Management:    Induction:   PONV Risk Score and Plan: 3 and Ondansetron, Dexamethasone, Midazolam and Treatment may vary due to age or medical condition  Airway Management Planned: Natural Airway  Additional Equipment:   Intra-op Plan:   Post-operative Plan:   Informed Consent: I have reviewed the patients History and Physical, chart, labs and discussed the procedure including the risks, benefits and alternatives for the proposed anesthesia with the patient or authorized representative who has indicated his/her understanding and acceptance.     Dental advisory given  Plan Discussed with: CRNA  Anesthesia Plan Comments:  Anesthesia Quick Evaluation  

## 2020-06-28 LAB — CBC
HCT: 29 % — ABNORMAL LOW (ref 36.0–46.0)
Hemoglobin: 8.9 g/dL — ABNORMAL LOW (ref 12.0–15.0)
MCH: 24.5 pg — ABNORMAL LOW (ref 26.0–34.0)
MCHC: 30.7 g/dL (ref 30.0–36.0)
MCV: 79.7 fL — ABNORMAL LOW (ref 80.0–100.0)
Platelets: 177 10*3/uL (ref 150–400)
RBC: 3.64 MIL/uL — ABNORMAL LOW (ref 3.87–5.11)
RDW: 13.2 % (ref 11.5–15.5)
WBC: 13.9 10*3/uL — ABNORMAL HIGH (ref 4.0–10.5)
nRBC: 0 % (ref 0.0–0.2)

## 2020-06-28 LAB — COMPREHENSIVE METABOLIC PANEL
ALT: 18 U/L (ref 0–44)
AST: 22 U/L (ref 15–41)
Albumin: 2.2 g/dL — ABNORMAL LOW (ref 3.5–5.0)
Alkaline Phosphatase: 136 U/L — ABNORMAL HIGH (ref 38–126)
Anion gap: 8 (ref 5–15)
BUN: 6 mg/dL (ref 6–20)
CO2: 25 mmol/L (ref 22–32)
Calcium: 9 mg/dL (ref 8.9–10.3)
Chloride: 106 mmol/L (ref 98–111)
Creatinine, Ser: 0.67 mg/dL (ref 0.44–1.00)
GFR, Estimated: >60 mL/min (ref >60)
Glucose, Bld: 100 mg/dL — ABNORMAL HIGH (ref 70–99)
Potassium: 4.3 mmol/L (ref 3.5–5.1)
Sodium: 139 mmol/L (ref 135–145)
Total Bilirubin: 0.5 mg/dL (ref 0.3–1.2)
Total Protein: 5.4 g/dL — ABNORMAL LOW (ref 6.5–8.1)

## 2020-06-28 MED ORDER — ONDANSETRON HCL 4 MG PO TABS
8.0000 mg | ORAL_TABLET | Freq: Three times a day (TID) | ORAL | Status: DC | PRN
  Administered 2020-06-28: 8 mg via ORAL
  Filled 2020-06-28: qty 2

## 2020-06-28 MED ORDER — RHO D IMMUNE GLOBULIN 1500 UNIT/2ML IJ SOSY
300.0000 ug | PREFILLED_SYRINGE | Freq: Once | INTRAMUSCULAR | Status: AC
  Administered 2020-06-28: 300 ug via INTRAVENOUS
  Filled 2020-06-28: qty 2

## 2020-06-28 NOTE — Social Work (Signed)
CSW received consult for a history of Postpartum Depression. CSW met with MOB to offer support and complete assessment.    CSW introduced self and role. CSW observed baby in bassinet and FOB bedside. CSW asked MOB if she would like to speak alone for privacy, MOB declined. CSW informed MOB of reason for consult, MOB expressed understanding. MOB stated she is currently feeling great. MOB disclosed she has PPD really bad after having her daughter in 2018. MOB stated it last for about a year and six months and she had to be hospitalized in [place]. CSW asked MOB to describe how her PPD looked. MOB shared she had suicidal ideations and felt hopeless. MOB expressed once she was hospitalized she received medication and therapy which were both helpful. MOB and FOB both expressed MOB learned great coping techniques while in the hospital. MOB stated following the hospitalization, the PPD only lasted about three more months. MOB stated she learned how to talk through things and identfieid FOB and her mother as supports when needed. CSW commended MOB on being so insightful and aware of how to manage PPD symptoms. MOB stated she has already spoken with her MD to obtain medication prior to discharge. MOB expressed she feels she has learned grown and learned more skills since her previous experience. MOB denies any current SI or HI. CSW asked MOB if she feels comfortable reaching out to her community supports if she begans experiencing any postpartum mental health challenges, MOB stated yes.    CSW provided education regarding the baby blues period vs. perinatal mood disorders, discussed treatment and gave resources for mental health follow up if concerns arise.  CSW recommends self-evaluation during the postpartum time period using the New Mom Checklist from Postpartum Progress and encouraged MOB to contact a medical professional if symptoms are noted at any time.    CSW provided review of Sudden Infant Death Syndrome  (SIDS) precautions. MOB stated baby will sleep in a basinet and crib once discharged home. MOB stated they have all of the essential needs for baby to discharge home, including a brand new carseat. Baby will receive follow-up care at [facility]. MOB denies any transportation barriers.     CSW identifies no further need for intervention and no barriers to discharge at this time.  Milady Fleener, LCSWA Clinical Social Work [facility] [phone] 

## 2020-06-28 NOTE — Progress Notes (Signed)
Subjective: Postpartum Day 1: Cesarean Delivery Patient reports tolerating PO and no problems voiding.  No feelings of palpitations, chest pain or shortness of breath.  No HA, vision changes or RUQ pain.  Desires circ.  Objective: Vital signs in last 24 hours: Temp:  [97.6 F (36.4 C)-98.9 F (37.2 C)] 98.5 F (36.9 C) (10/20 0442) Pulse Rate:  [48-73] 58 (10/20 0442) Resp:  [0-21] 17 (10/20 0442) BP: (126-147)/(70-93) 130/74 (10/20 0442) SpO2:  [98 %-100 %] 99 % (10/19 2058)  Physical Exam:  General: alert, cooperative and appears stated age Lochia: appropriate Uterine Fundus: firm Incision: wound vac in place DVT Evaluation: No evidence of DVT seen on physical exam. Negative Homan's sign. No cords or calf tenderness.  Recent Labs    06/26/20 1257 06/28/20 0501  HGB 10.2* 8.9*  HCT 32.0* 29.0*    Assessment/Plan: Status post Cesarean section. Doing well postoperatively.  Continue current care. Gestational hypertension-asymptomatic, labs stable.  Will continue to monitor.  H/O postpartum cardiomyopathy-currently asymptomatic.  Will continue to monitor.   Patient is counseled for circ including risk of bleeding, infection and scarring.  All questions were answered and patient wishes to proceed.  Mitchel Honour 06/28/2020, 9:23 AM

## 2020-06-29 LAB — RH IG WORKUP (INCLUDES ABO/RH)
ABO/RH(D): A NEG
Fetal Screen: NEGATIVE
Gestational Age(Wks): 37
Unit division: 0

## 2020-06-29 LAB — SURGICAL PATHOLOGY

## 2020-06-29 MED ORDER — IBUPROFEN 800 MG PO TABS: 800.0000 mg | ORAL_TABLET | Freq: Four times a day (QID) | ORAL | 0 refills | Status: AC

## 2020-06-29 MED ORDER — OXYCODONE HCL 5 MG PO TABS
5.0000 mg | ORAL_TABLET | ORAL | 0 refills | Status: AC | PRN
Start: 2020-06-29 — End: ?

## 2020-06-29 NOTE — Discharge Summary (Signed)
Postpartum Discharge Summary  Date of Service June 29, 2020     Patient Name: Melinda Riddle DOB: 1996/02/19 MRN: [id number]  Date of admission: 06/27/2020 Delivery date:06/27/2020  Delivering provider: Tyson Dense  Date of discharge: 06/29/2020  Admitting diagnosis: Pregnancy [Z34.90] Intrauterine pregnancy: [redacted]w[redacted]d     Secondary diagnosis:  Active Problems:   Pregnancy  Additional problems: Severe Preeclampsia    Discharge diagnosis: Term Pregnancy Delivered                                              Post partum procedures:none Augmentation: N/A Complications: None  Hospital course: Sceduled C/S   24 y.o. yo G2P2002 at [redacted]w[redacted]d was admitted to the hospital 06/27/2020 for scheduled cesarean section with the following indication:Elective Repeat.Delivery details are as follows:  Membrane Rupture Time/Date: 2:24 PM ,06/27/2020   Delivery Method:C-Section, Low Transverse  Details of operation can be found in separate operative note.  Patient had an uncomplicated postpartum course.  She is ambulating, tolerating a regular diet, passing flatus, and urinating well. Patient is discharged home in stable condition on  06/29/20        Newborn Data: Birth date:06/27/2020  Birth time:2:25 PM  Gender:Female  Living status:Living  Apgars:6 ,9  Weight:2790 g     Magnesium Sulfate received: No BMZ received: No Rhophylac:No MMR:No T-DaP:Given prenatally Flu: No Transfusion:No  Physical exam  Vitals:   06/27/20 2058 06/28/20 0040 06/28/20 0442 06/28/20 2026  BP: 139/70 127/72 130/74 122/74  Pulse: (!) 50 (!) 54 (!) 58 (!) 53  Resp: $Remo'17 18 17 16  'KlVkY$ Temp: 98.3 F (36.8 C) 98.9 F (37.2 C) 98.5 F (36.9 C) (!) 97.2 F (36.2 C)  TempSrc: Oral Oral Oral   SpO2: 99%   100%   General: alert Lochia: appropriate Uterine Fundus: firm Incision: Healing well with no significant drainage DVT Evaluation: No evidence of DVT seen on physical exam. Labs: Lab Results   Component Value Date   WBC 13.9 (H) 06/28/2020   HGB 8.9 (L) 06/28/2020   HCT 29.0 (L) 06/28/2020   MCV 79.7 (L) 06/28/2020   PLT 177 06/28/2020   CMP Latest Ref Rng & Units 06/28/2020  Glucose 70 - 99 mg/dL 100(H)  BUN 6 - 20 mg/dL 6  Creatinine 0.44 - 1.00 mg/dL 0.67  Sodium 135 - 145 mmol/L 139  Potassium 3.5 - 5.1 mmol/L 4.3  Chloride 98 - 111 mmol/L 106  CO2 22 - 32 mmol/L 25  Calcium 8.9 - 10.3 mg/dL 9.0  Total Protein 6.5 - 8.1 g/dL 5.4(L)  Total Bilirubin 0.3 - 1.2 mg/dL 0.5  Alkaline Phos 38 - 126 U/L 136(H)  AST 15 - 41 U/L 22  ALT 0 - 44 U/L 18   Edinburgh Score: Edinburgh Postnatal Depression Scale Screening Tool 06/29/2020  I have been able to laugh and see the funny side of things. 0  I have looked forward with enjoyment to things. 0  I have blamed myself unnecessarily when things went wrong. 1  I have been anxious or worried for no good reason. 2  I have felt scared or panicky for no good reason. 1  Things have been getting on top of me. 1  I have been so unhappy that I have had difficulty sleeping. 1  I have felt sad or miserable. 1  I have been so  unhappy that I have been crying. 0  The thought of harming myself has occurred to me. 1  Edinburgh Postnatal Depression Scale Total 8      After visit meds:  Allergies as of 06/29/2020      Reactions   Chlorhexidine Itching, Rash, Other (See Comments)   Burning    Tape Rash      Medication List    TAKE these medications   acetaminophen 500 MG tablet Commonly known as: TYLENOL Take 500 mg by mouth every 6 (six) hours as needed for headache.   ibuprofen 800 MG tablet Commonly known as: ADVIL Take 1 tablet (800 mg total) by mouth every 6 (six) hours.   ondansetron 4 MG disintegrating tablet Commonly known as: ZOFRAN-ODT Take 4 mg by mouth every 8 (eight) hours as needed for nausea or vomiting.   oxyCODONE 5 MG immediate release tablet Commonly known as: Oxy IR/ROXICODONE Take 1-2 tablets (5-10  mg total) by mouth every 4 (four) hours as needed for moderate pain.   potassium chloride 10 MEQ tablet Commonly known as: KLOR-CON Take 1 tablet (10 mEq total) by mouth daily.   prenatal multivitamin Tabs tablet Take 1 tablet by mouth daily at 12 noon.   zolpidem 5 MG tablet Commonly known as: AMBIEN Take 1 tablet (5 mg total) by mouth at bedtime as needed for up to 1 dose for sleep.        Discharge home in stable condition Infant Feeding: Breast Infant Disposition:home with mother Discharge instruction: per After Visit Summary and Postpartum booklet. Activity: Advance as tolerated. Pelvic rest for 6 weeks.  Diet: routine diet Anticipated Birth Control: BTL done PP Postpartum Appointment:1 week Additional Postpartum F/U: Incision check 1 week and BP check 1 week Future Appointments:No future appointments. Follow up Visit:      06/29/2020 Cyril Mourning, MD

## 2020-08-24 ENCOUNTER — Institutional Professional Consult (permissible substitution): Payer: BC Managed Care – PPO | Admitting: Plastic Surgery

## 2020-11-30 ENCOUNTER — Institutional Professional Consult (permissible substitution): Payer: BC Managed Care – PPO | Admitting: Plastic Surgery

## 2021-01-01 ENCOUNTER — Other Ambulatory Visit: Payer: Self-pay

## 2021-01-01 ENCOUNTER — Ambulatory Visit
Admission: RE | Admit: 2021-01-01 | Discharge: 2021-01-01 | Disposition: A | Discharge status: Home / Self Care | Payer: BC Managed Care – PPO | Source: Ambulatory Visit | Attending: Family Medicine | Admitting: Family Medicine

## 2021-01-01 VITALS — BP 141/84 | HR 95 | Temp 99.2°F | Resp 18 | Wt 182.0 lb

## 2021-01-01 DIAGNOSIS — B349 Viral infection, unspecified: Secondary | ICD-10-CM | POA: Diagnosis not present

## 2021-01-01 MED ORDER — FAMOTIDINE 20 MG PO TABS: 20.0000 mg | ORAL_TABLET | Freq: Two times a day (BID) | ORAL | 0 refills | Status: AC

## 2021-01-01 MED ORDER — ONDANSETRON 4 MG PO TBDP: 4.0000 mg | ORAL_TABLET | Freq: Three times a day (TID) | ORAL | 0 refills | Status: AC | PRN

## 2021-01-01 MED ORDER — PROMETHAZINE-DM 6.25-15 MG/5ML PO SYRP: 5.0000 mL | ORAL_SOLUTION | Freq: Four times a day (QID) | ORAL | 0 refills | Status: AC | PRN

## 2021-01-01 NOTE — ED Provider Notes (Signed)
[facility]    CSN: [id number] Arrival date & time: 01/01/21  4166      History   Chief Complaint No chief complaint on file.   HPI Melinda Riddle is a 25 y.o. female.   HPI  Patient presents with viral symptoms of cough, sore throat, nasal congestion, diarrhea, nausea with vomiting. Onset x 1 days. Children have been ill with similar symptoms and child's school recently closed due to COVID illness. She has not taken any medication for her symptoms.  Last vomiting and loose stool today.  Denies shortness of breath or wheezing.  Past Medical History:  Diagnosis Date  . Depression    PPD took meds  . Family history of adverse reaction to anesthesia    grandmother is hard to wake up  . History of bronchitis as a child   . History of kidney stones 10/2015  . Hypertension    "since preg has been higher"  . Migraines    takes Propranolol daily and Zanaflex as needed  . PONV (postoperative nausea and vomiting)    hard to wake up  . Pseudotumor cerebri   . SVT (supraventricular tachycardia) [facility])     Patient Active Problem List   Diagnosis Date Noted  . Pregnancy 06/27/2020  . 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 History of cholecystectomy 05/25/2020  . History of cesarean section 05/25/2020  . Rubella non-immune status, antepartum 05/25/2020  . 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 History of severe pre-eclampsia 06/10/2017  . Migraine without aura and without status migrainosus, not intractable 06/23/2015  . Episodic tension-type headache, not intractable 03/02/2015  . Morbid obesity (HCC) 11/19/2014  . Idiopathic intracranial hypertension 11/16/2014  . Acanthosis nigricans, acquired 08/18/2014    Past Surgical History:  Procedure Laterality Date  . CESAREAN SECTION N/A 06/10/2017   Procedure: CESAREAN SECTION;  Surgeon: Harold Hedge, MD;  Location: [facility] BIRTHING SUITES;  Service: Obstetrics;  Laterality: N/A;  . CESAREAN SECTION WITH BILATERAL  TUBAL LIGATION N/A 06/27/2020   Procedure: REPEAT CESAREAN SECTION WITH BILATERAL TUBAL LIGATION;  Surgeon: Ranae Pila, MD;  Location: [facility] LD ORS;  Service: Obstetrics;  Laterality: N/A;  . CHOLECYSTECTOMY N/A 12/20/2015   Procedure: LAPAROSCOPIC CHOLECYSTECTOMY;  Surgeon: Abigail Miyamoto, MD;  Location: [facility] OR;  Service: General;  Laterality: N/A;  . GASTRIC BYPASS    . TONSILLECTOMY AND ADENOIDECTOMY     4 or 24 years old     OB History    Gravida  2   Para  2   Term  2   Preterm      AB      Living  2     SAB      IAB      Ectopic      Multiple  0   Live Births  2            Home Medications    Prior to Admission medications   Medication Sig Start Date End Date Taking? Authorizing Provider  acetaminophen (TYLENOL) 500 MG tablet Take 500 mg by mouth every 6 (six) hours as needed for headache.     [provider]  ibuprofen (ADVIL) 800 MG tablet Take 1 tablet (800 mg total) by mouth every 6 (six) hours. 06/29/20   Marcelle Overlie, MD  ondansetron (ZOFRAN-ODT) 4 MG disintegrating tablet Take 4 mg by mouth every 8 (eight) hours as needed for nausea or vomiting.    [provider]  oxyCODONE (OXY IR/ROXICODONE) 5  MG immediate release tablet Take 1-2 tablets (5-10 mg total) by mouth every 4 (four) hours as needed for moderate pain. 06/29/20   Marcelle Overlie, MD  potassium chloride (KLOR-CON) 10 MEQ tablet Take 1 tablet (10 mEq total) by mouth daily. 06/19/20   Rasch, Harolyn Rutherford, NP  Prenatal Vit-Fe Fumarate-FA (PRENATAL MULTIVITAMIN) TABS tablet Take 1 tablet by mouth daily at 12 noon.    [provider]  zolpidem (AMBIEN) 5 MG tablet Take 1 tablet (5 mg total) by mouth at bedtime as needed for up to 1 dose for sleep. 06/26/20   Calvert Cantor, CNM    Family History Family History  Problem Relation Age of Onset  . Lung cancer Maternal Grandfather        Died at 84  . Cancer Maternal Grandfather   . Hypertension Mother    . Diabetes Sister   . Heart disease Maternal Grandmother   . Diabetes Maternal Aunt   . Diabetes Maternal Uncle   . Cancer Maternal Uncle     Social History Social History   Tobacco Use  . Smoking status: Former Smoker    Types: Cigarettes  . Smokeless tobacco: Never Used  . Tobacco comment: Mom and step dad smoke outside, pt quit age 27  Vaping Use  . Vaping Use: Never used  Substance Use Topics  . Alcohol use: No    Alcohol/week: 0.0 standard drinks  . Drug use: No     Allergies   Chlorhexidine and Tape   Review of Systems Review of Systems Pertinent negatives listed in HPI   Physical Exam Triage Vital Signs ED Triage Vitals  Enc Vitals Group     BP 01/01/21 1030 (!) 141/84     Pulse Rate 01/01/21 1030 95     Resp 01/01/21 1030 18     Temp 01/01/21 1030 99.2 F (37.3 C)     Temp Source 01/01/21 1030 Oral     SpO2 01/01/21 1030 100 %     Weight 01/01/21 1029 182 lb (82.6 kg)     Height --      Head Circumference --      Peak Flow --      Pain Score 01/01/21 1028 0     Pain Loc --      Pain Edu? --      Excl. in GC? --    No data found.  Updated Vital Signs BP (!) 141/84 (BP Location: Right Arm)   Pulse 95   Temp 99.2 F (37.3 C) (Oral)   Resp 18   Wt 182 lb (82.6 kg)   LMP 12/27/2020   SpO2 100%   BMI 30.29 kg/m   Visual Acuity Right Eye Distance:   Left Eye Distance:   Bilateral Distance:    Right Eye Near:   Left Eye Near:    Bilateral Near:     Physical Exam  General Appearance:    Alert, acutely ill looking, cooperative, no distress  HENT:   Normocephalic, ears normal, nares mucosal edema with congestion, rhinorrhea, oropharynx pale w/o exudate or edema   Eyes:    PERRL, conjunctiva/corneas clear, EOM's intact       Lungs:     Clear to auscultation bilaterally, respirations unlabored  Heart:    Regular rate and rhythm  Neurologic:   Awake, alert, oriented x 3. No apparent focal neurological           defect.      UC  Treatments /  Results  Labs (all labs ordered are listed, but only abnormal results are displayed) Labs Reviewed - No data to display  EKG   Radiology No results found.  Procedures Procedures (including critical care time)  Medications Ordered in UC Medications - No data to display  Initial Impression / Assessment and Plan / UC Course  I have reviewed the triage vital signs and the nursing notes.  Pertinent labs & imaging results that were available during my care of the patient were reviewed by me and considered in my medical decision making (see chart for details).     COVID/flu test pending.  Symptom management warranted only for viral illness.  Treatment per discharge instructions.  Patient advised to quarantine over the next 2 days given combination GI symptoms and possible flu/COVID symptoms.  School note provided. Final Clinical Impressions(s) / UC Diagnoses   Final diagnoses:  Viral illness     Discharge Instructions     COVID/Flu test pending. Symptom management warranted only.  Manage fever with Tylenol and ibuprofen.  Nasal symptoms with over-the-counter antihistamines recommended.  Treatment per discharge medications/discharge instructions.  Red flags/ER precautions given. The most current CDC isolation/quarantine recommendation advised.    MyChart user name [email or web address] Temporary password: [phone], you will be prompted to restart     ED Prescriptions    Medication Sig Dispense Auth. Provider   ondansetron (ZOFRAN-ODT) 4 MG disintegrating tablet Take 1 tablet (4 mg total) by mouth every 8 (eight) hours as needed for nausea or vomiting. 20 tablet Bing Neighbors, FNP   famotidine (PEPCID) 20 MG tablet Take 1 tablet (20 mg total) by mouth 2 (two) times daily. 30 tablet Bing Neighbors, FNP   promethazine-dextromethorphan (PROMETHAZINE-DM) 6.25-15 MG/5ML syrup Take 5 mLs by mouth 4 (four) times daily as needed for cough. 118 mL Bing Neighbors, FNP      PDMP not reviewed this encounter.   Bing Neighbors, FNP 01/01/21 1110

## 2021-01-01 NOTE — ED Triage Notes (Signed)
Sore throat started yesterday morning. headache, vomiting x5 and diarrhea that started this morning.  Chest soreness and dizziness with coughing that started last night.

## 2021-01-01 NOTE — Discharge Instructions (Addendum)
COVID/Flu test pending. Symptom management warranted only.  Manage fever with Tylenol and ibuprofen.  Nasal symptoms with over-the-counter antihistamines recommended.  Treatment per discharge medications/discharge instructions.  Red flags/ER precautions given. The most current CDC isolation/quarantine recommendation advised.    MyChart user name [email or web address] Temporary password: [phone], you will be prompted to restart

## 2021-01-02 LAB — COVID-19, FLU A+B NAA
Influenza A, NAA: NOT DETECTED
Influenza B, NAA: NOT DETECTED
SARS-CoV-2, NAA: DETECTED — AB

## 2021-05-16 ENCOUNTER — Institutional Professional Consult (permissible substitution): Payer: BC Managed Care – PPO | Admitting: Plastic Surgery

## 2021-10-18 ENCOUNTER — Emergency Department (HOSPITAL_BASED_OUTPATIENT_CLINIC_OR_DEPARTMENT_OTHER)
Admission: EM | Admit: 2021-10-18 | Discharge: 2021-10-18 | Disposition: A | Discharge status: Home / Self Care | Payer: Medicaid Other | Attending: Emergency Medicine | Admitting: Emergency Medicine

## 2021-10-18 ENCOUNTER — Encounter (HOSPITAL_BASED_OUTPATIENT_CLINIC_OR_DEPARTMENT_OTHER): Payer: Self-pay | Admitting: Emergency Medicine

## 2021-10-18 ENCOUNTER — Emergency Department (HOSPITAL_BASED_OUTPATIENT_CLINIC_OR_DEPARTMENT_OTHER): Payer: Medicaid Other

## 2021-10-18 ENCOUNTER — Other Ambulatory Visit: Payer: Self-pay

## 2021-10-18 DIAGNOSIS — R079 Chest pain, unspecified: Secondary | ICD-10-CM

## 2021-10-18 DIAGNOSIS — D649 Anemia, unspecified: Secondary | ICD-10-CM | POA: Diagnosis not present

## 2021-10-18 DIAGNOSIS — Z79899 Other long term (current) drug therapy: Secondary | ICD-10-CM | POA: Diagnosis not present

## 2021-10-18 LAB — CBC
HCT: 33 % — ABNORMAL LOW (ref 36.0–46.0)
Hemoglobin: 9.3 g/dL — ABNORMAL LOW (ref 12.0–15.0)
MCH: 18.2 pg — ABNORMAL LOW (ref 26.0–34.0)
MCHC: 28.2 g/dL — ABNORMAL LOW (ref 30.0–36.0)
MCV: 64.6 fL — ABNORMAL LOW (ref 80.0–100.0)
Platelets: 375 10*3/uL (ref 150–400)
RBC: 5.11 MIL/uL (ref 3.87–5.11)
RDW: 26.5 % — ABNORMAL HIGH (ref 11.5–15.5)
WBC: 7.1 10*3/uL (ref 4.0–10.5)
nRBC: 0 % (ref 0.0–0.2)

## 2021-10-18 LAB — BASIC METABOLIC PANEL
Anion gap: 10 (ref 5–15)
BUN: 9 mg/dL (ref 6–20)
CO2: 24 mmol/L (ref 22–32)
Calcium: 9.1 mg/dL (ref 8.9–10.3)
Chloride: 106 mmol/L (ref 98–111)
Creatinine, Ser: 0.63 mg/dL (ref 0.44–1.00)
GFR, Estimated: >60 mL/min (ref >60)
Glucose, Bld: 84 mg/dL (ref 70–99)
Potassium: 4.1 mmol/L (ref 3.5–5.1)
Sodium: 140 mmol/L (ref 135–145)

## 2021-10-18 LAB — PREGNANCY, URINE: Preg Test, Ur: NEGATIVE

## 2021-10-18 LAB — TROPONIN I (HIGH SENSITIVITY): Troponin I (High Sensitivity): <2 ng/L (ref <18)

## 2021-10-18 MED ORDER — ACETAMINOPHEN 500 MG PO TABS
1000.0000 mg | ORAL_TABLET | Freq: Once | ORAL | Status: AC
  Administered 2021-10-18: 1000 mg via ORAL
  Filled 2021-10-18: qty 2

## 2021-10-18 NOTE — ED Notes (Signed)
Patient verbalizes understanding of discharge instructions. Opportunity for questioning and answers were provided. Patient discharged from ED.  °

## 2021-10-18 NOTE — ED Provider Notes (Signed)
[facility] EMERGENCY DEPT Provider Note   CSN: [id number] Arrival date & time: 10/18/21  2229     History  Chief Complaint  Patient presents with   Dizziness   Chest Pain    Melek Pownall is a 26 y.o. female.  HPI 26 year old female presents with chest and back pain.  She has been having bloody stools/rectal bleeding for quite some time.  It has been worse over the past 1 month.  She required a transfusion about a month ago.  States that since that time she has been having on and off chest pain and dizzy spells and has been passing out, most recently yesterday.  Chest pain feels like a tightness and crushing in the middle of her chest.  Sometimes goes to her back.  She also concomitantly has some left low back pain on and off.  Symptoms started back yesterday.  No fevers or cough or shortness of breath.  Continues to have the bloody stools.  She is set up for a colonoscopy but its not ready until next month.  They tried to call GI after her most recent ED visit at [facility] in [place] on 2/3 but they were unable to move it up.  At that time they were told she would probably need a blood transfusion soon.  Home Medications Prior to Admission medications   Medication Sig Start Date End Date Taking? Authorizing Provider  ferrous sulfate 325 (65 FE) MG tablet Take 325 mg by mouth daily with breakfast.   Yes [provider]  acetaminophen (TYLENOL) 500 MG tablet Take 500 mg by mouth every 6 (six) hours as needed for headache.     [provider]  famotidine (PEPCID) 20 MG tablet Take 1 tablet (20 mg total) by mouth 2 (two) times daily. 01/01/21   Bing Neighbors, FNP  ibuprofen (ADVIL) 800 MG tablet Take 1 tablet (800 mg total) by mouth every 6 (six) hours. 06/29/20   Marcelle Overlie, MD  ondansetron (ZOFRAN-ODT) 4 MG disintegrating tablet Take 1 tablet (4 mg total) by mouth every 8 (eight) hours as needed for nausea or vomiting. 01/01/21   Bing Neighbors, FNP  oxyCODONE (OXY IR/ROXICODONE) 5 MG immediate release tablet Take 1-2 tablets (5-10 mg total) by mouth every 4 (four) hours as needed for moderate pain. 06/29/20   Marcelle Overlie, MD  potassium chloride (KLOR-CON) 10 MEQ tablet Take 1 tablet (10 mEq total) by mouth daily. 06/19/20   Rasch, Harolyn Rutherford, NP  Prenatal Vit-Fe Fumarate-FA (PRENATAL MULTIVITAMIN) TABS tablet Take 1 tablet by mouth daily at 12 noon.    [provider]  promethazine-dextromethorphan (PROMETHAZINE-DM) 6.25-15 MG/5ML syrup Take 5 mLs by mouth 4 (four) times daily as needed for cough. 01/01/21   Bing Neighbors, FNP  zolpidem (AMBIEN) 5 MG tablet Take 1 tablet (5 mg total) by mouth at bedtime as needed for up to 1 dose for sleep. 06/26/20   Calvert Cantor, CNM      Allergies    Chlorhexidine and Tape    Review of Systems   Review of Systems  Constitutional:  Negative for fever.  Respiratory:  Negative for shortness of breath.   Cardiovascular:  Positive for chest pain.  Gastrointestinal:  Positive for abdominal pain (on and off, none now) and blood in stool.  Musculoskeletal:  Positive for back pain.  Neurological:  Positive for light-headedness.   Physical Exam Updated Vital Signs BP 140/90    Pulse 95  Temp 98 F (36.7 C) (Oral)    Resp 18    SpO2 100%  Physical Exam Vitals and nursing note reviewed.  Constitutional:      General: She is not in acute distress.    Appearance: She is well-developed. She is not ill-appearing or diaphoretic.  HENT:     Head: Normocephalic and atraumatic.  Cardiovascular:     Rate and Rhythm: Normal rate and regular rhythm.     Heart sounds: Normal heart sounds.  Pulmonary:     Effort: Pulmonary effort is normal.     Breath sounds: Normal breath sounds.  Abdominal:     Palpations: Abdomen is soft.     Tenderness: There is no abdominal tenderness.  Musculoskeletal:     Cervical back: No tenderness.     Thoracic back: No tenderness.      Lumbar back: No tenderness.  Skin:    General: Skin is warm and dry.  Neurological:     Mental Status: She is alert.    ED Results / Procedures / Treatments   Labs (all labs ordered are listed, but only abnormal results are displayed) Labs Reviewed  CBC - Abnormal; Notable for the following components:      Result Value   Hemoglobin 9.3 (*)    HCT 33.0 (*)    MCV 64.6 (*)    MCH 18.2 (*)    MCHC 28.2 (*)    RDW 26.5 (*)    All other components within normal limits  BASIC METABOLIC PANEL  PREGNANCY, URINE  TROPONIN I (HIGH SENSITIVITY)    EKG EKG Interpretation  Date/Time:  Thursday October 18 2021 09:27:52 EST Ventricular Rate:  86 PR Interval:  133 QRS Duration: 104 QT Interval:  357 QTC Calculation: 427 R Axis:   67 Text Interpretation: Sinus rhythm no acute ST/T changes similar to Oct 2021 Confirmed by Pricilla Loveless [phone]) on 10/18/2021 9:31:15 AM  Radiology DG Chest Portable 1 View  Result Date: 10/18/2021 CLINICAL DATA:  Chest pain EXAM: PORTABLE CHEST 1 VIEW COMPARISON:  Radiograph 05/20/2015 FINDINGS: Unchanged cardiomediastinal silhouette. No focal airspace consolidation. No large pleural effusion. No visible pneumothorax. There is no acute osseous abnormality. IMPRESSION: No evidence of acute cardiopulmonary disease. Electronically Signed   By: Caprice Renshaw M.D.   On: 10/18/2021 10:26    Procedures Procedures    Medications Ordered in ED Medications  acetaminophen (TYLENOL) tablet 1,000 mg (1,000 mg Oral Given 10/18/21 1022)    ED Course/ Medical Decision Making/ A&P                           Medical Decision Making Amount and/or Complexity of Data Reviewed Labs: ordered. Radiology: ordered.  Risk OTC drugs.   Patient is well-appearing.  I have reviewed her most recent ED notes, from 2/3 at an outside ED.  At that time, she had a similar presentation.  Her hemoglobin then was 9.2.  Today it is 9.3.  While she reports significant bleeding, I think  she is stable for further outpatient GI bleeding work-up as she has scheduled.  Otherwise, with chest pain starting yesterday and a normal troponin and a nonischemic ECG, I do not think further ACS work-up is needed.  I doubt PE or dissection.  I do not think any emergent imaging of her chest or abdomen are needed.  Her chest x-ray was reviewed by myself and shows no pneumonia.  ECG without ischemia.  At this point,  she does not appear to need a blood transfusion with stable anemia.  Will discharge to follow-up with her GI doctor.  Given return precautions.        Final Clinical Impression(s) / ED Diagnoses Final diagnoses:  Nonspecific chest pain  Chronic anemia    Rx / DC Orders ED Discharge Orders     None         Pricilla Loveless, MD 10/18/21 1542

## 2021-10-18 NOTE — Discharge Instructions (Signed)
If you develop recurrent, continued, or worsening chest pain, shortness of breath, fever, vomiting, abdominal or back pain, or any other new/concerning symptoms then return to the ER for evaluation.  

## 2021-10-18 NOTE — ED Triage Notes (Signed)
Pt has had dark blood in her stool for about a year, has been followed by GI and planned colonscopy in March. 2 weeks ago pt had blood transfusion. Since then patient has been dizzy, passing out 2-4 times a day due to dizziness, and chest pain. Chest pain is central, feels like crushing and is constant since yesterday.

## 2022-01-14 ENCOUNTER — Telehealth: Payer: Self-pay | Admitting: Plastic Surgery

## 2022-01-14 NOTE — Telephone Encounter (Signed)
Patient called to ask if we do fat grafting for breast augmentation. Please advise at [phone]. ?

## 2022-02-13 ENCOUNTER — Institutional Professional Consult (permissible substitution): Payer: Medicaid Other | Admitting: Plastic Surgery

## 2022-06-22 IMAGING — DX DG CHEST 1V PORT
1 series · 1 of 1 positions shown · non-contrast
Comparison: Radiograph 05/20/2015

CLINICAL DATA: Chest pain

EXAM:
PORTABLE CHEST 1 VIEW

[chest ap]
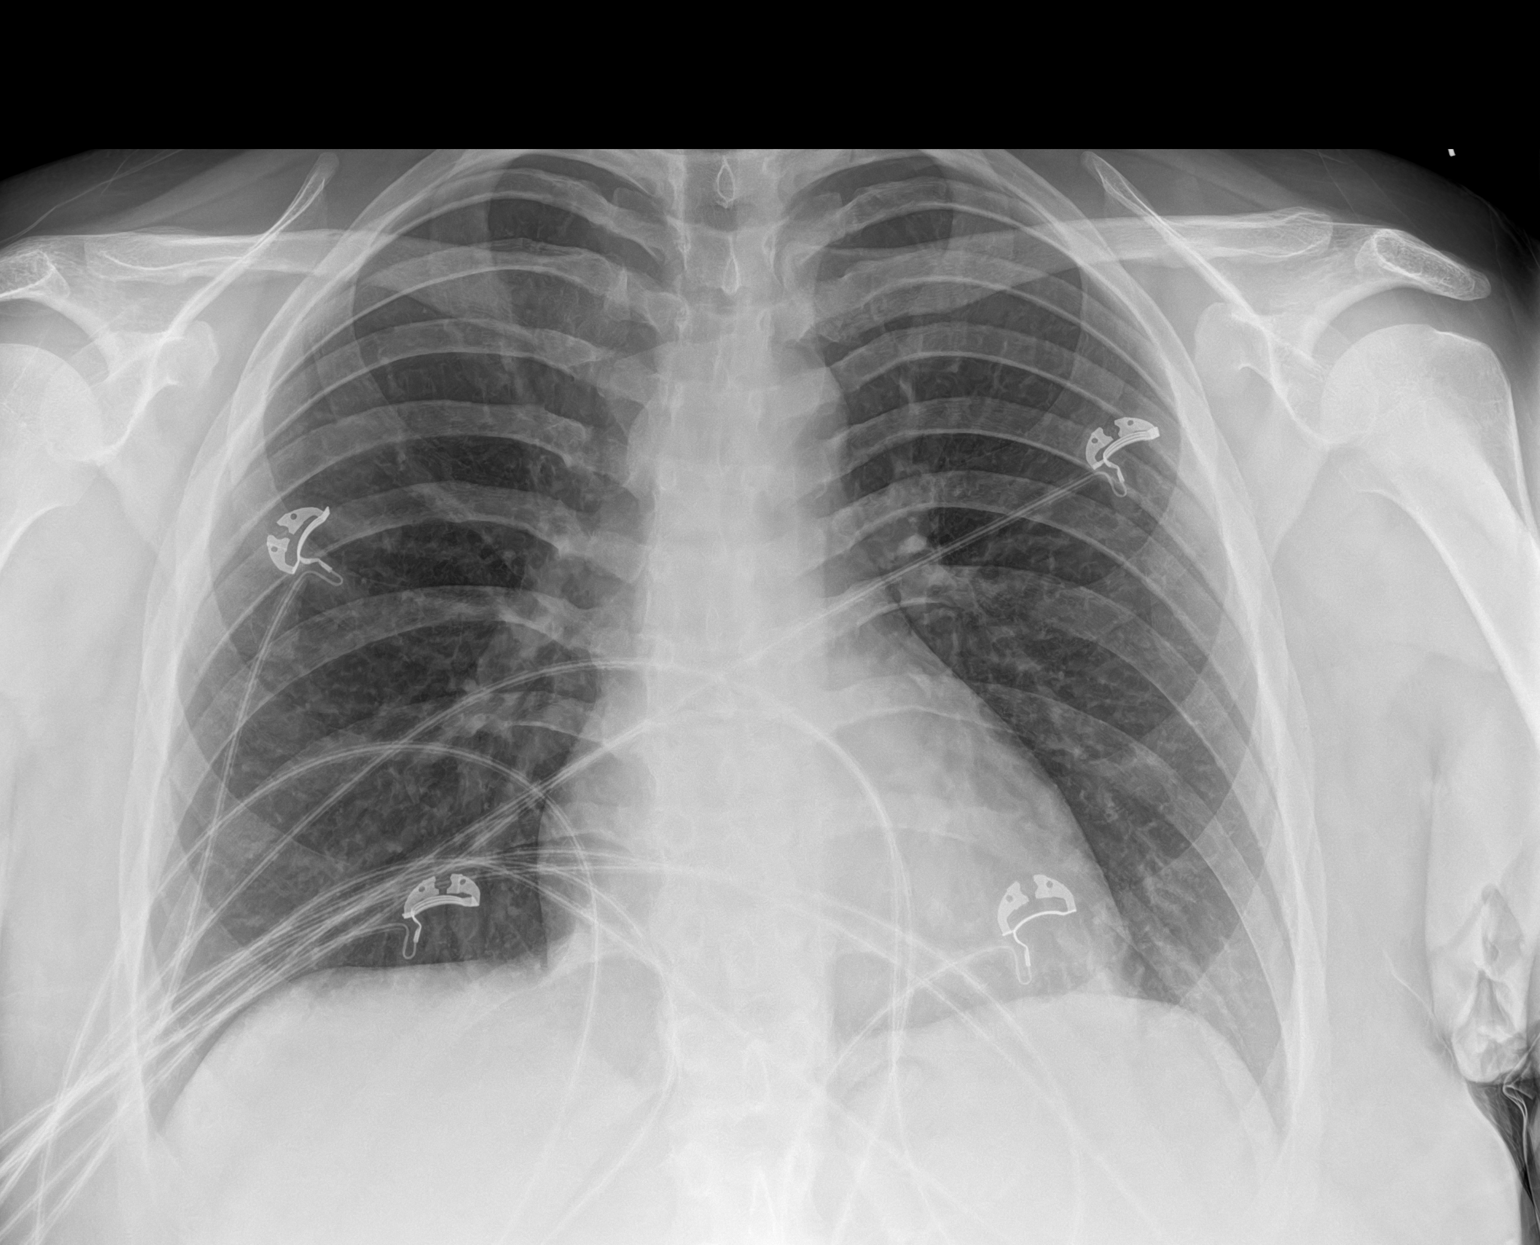

[1 of 1 positions shown; findings below may reference images not displayed]

FINDINGS: Unchanged cardiomediastinal silhouette. No focal airspace
consolidation. No large pleural effusion. No visible pneumothorax.
There is no acute osseous abnormality.
IMPRESSION: No evidence of acute cardiopulmonary disease.
